# Patient Record
Sex: Male | Born: 1940 | Hispanic: No | Marital: Married | Smoking: Never smoker
Health system: Southern US, Community
[De-identification: ages and names within clinical notes are randomized; demographics above are authoritative.]

## PROBLEM LIST (undated history)

## (undated) DIAGNOSIS — I4891 Unspecified atrial fibrillation: Secondary | ICD-10-CM

## (undated) DIAGNOSIS — I2510 Atherosclerotic heart disease of native coronary artery without angina pectoris: Secondary | ICD-10-CM

## (undated) DIAGNOSIS — N200 Calculus of kidney: Secondary | ICD-10-CM

## (undated) DIAGNOSIS — K5730 Diverticulosis of large intestine without perforation or abscess without bleeding: Secondary | ICD-10-CM

## (undated) HISTORY — PX: CARDIAC SURGERY: SHX584

## (undated) HISTORY — PX: HERNIA REPAIR: SHX51

## (undated) HISTORY — PX: BICEPS TENDON REPAIR: SHX566

## (undated) HISTORY — PX: HEMORROIDECTOMY: SUR656

---

## 2003-03-06 ENCOUNTER — Ambulatory Visit (HOSPITAL_COMMUNITY): Admission: RE | Admit: 2003-03-06 | Discharge: 2003-03-06 | Payer: Self-pay | Admitting: Orthopedic Surgery

## 2003-03-06 ENCOUNTER — Ambulatory Visit (HOSPITAL_BASED_OUTPATIENT_CLINIC_OR_DEPARTMENT_OTHER): Admission: RE | Admit: 2003-03-06 | Discharge: 2003-03-06 | Payer: Self-pay | Admitting: Orthopedic Surgery

## 2011-01-11 ENCOUNTER — Emergency Department (INDEPENDENT_AMBULATORY_CARE_PROVIDER_SITE_OTHER): Payer: Medicare Other

## 2011-01-11 ENCOUNTER — Emergency Department (HOSPITAL_BASED_OUTPATIENT_CLINIC_OR_DEPARTMENT_OTHER)
Admission: EM | Admit: 2011-01-11 | Discharge: 2011-01-11 | Disposition: A | Discharge status: Home / Self Care | Payer: Medicare Other | Attending: Emergency Medicine | Admitting: Emergency Medicine

## 2011-01-11 ENCOUNTER — Encounter: Payer: Self-pay | Admitting: *Deleted

## 2011-01-11 DIAGNOSIS — S61209A Unspecified open wound of unspecified finger without damage to nail, initial encounter: Secondary | ICD-10-CM

## 2011-01-11 DIAGNOSIS — S62509B Fracture of unspecified phalanx of unspecified thumb, initial encounter for open fracture: Secondary | ICD-10-CM

## 2011-01-11 DIAGNOSIS — S62639B Displaced fracture of distal phalanx of unspecified finger, initial encounter for open fracture: Secondary | ICD-10-CM | POA: Insufficient documentation

## 2011-01-11 DIAGNOSIS — IMO0002 Reserved for concepts with insufficient information to code with codable children: Secondary | ICD-10-CM | POA: Insufficient documentation

## 2011-01-11 DIAGNOSIS — X58XXXA Exposure to other specified factors, initial encounter: Secondary | ICD-10-CM

## 2011-01-11 DIAGNOSIS — M79609 Pain in unspecified limb: Secondary | ICD-10-CM

## 2011-01-11 DIAGNOSIS — Y9359 Activity, other involving other sports and athletics played individually: Secondary | ICD-10-CM | POA: Insufficient documentation

## 2011-01-11 MED ORDER — CEPHALEXIN 500 MG PO CAPS: 500.0000 mg | ORAL_CAPSULE | Freq: Four times a day (QID) | ORAL | Status: AC

## 2011-01-11 MED ORDER — LIDOCAINE HCL 2 % IJ SOLN
20.0000 mL | Freq: Once | INTRAMUSCULAR | Status: DC
  Filled 2011-01-11: qty 1

## 2011-01-11 NOTE — ED Notes (Signed)
Pt states he was using a crossbow and injured his left thumb-lac to same per pt. "Pressure dressing applied by pt, but he states it will not stop bleeding"

## 2011-01-11 NOTE — ED Provider Notes (Signed)
Medical screening examination/treatment/procedure(s) were performed by non-physician practitioner and as supervising physician I was immediately available for consultation/collaboration.   Tonisha Silvey A Savior Himebaugh, MD 01/11/11 1926 

## 2011-01-11 NOTE — ED Provider Notes (Signed)
History     CSN: [id number] Arrival date & time: 01/11/2011  6:26 PM  Chief Complaint  Patient presents with  . Laceration    (Consider location/radiation/quality/duration/timing/severity/associated sxs/prior treatment) HPI Comments: Pt states that while using the crossbow he cut himself and he was unable to get it to stop bleeding  Patient is a 70 y.o. male presenting with skin laceration. The history is provided by the patient. No language interpreter was used.  Laceration  The incident occurred 1 to 2 hours ago. The laceration is located on the left hand. The laceration is 2 cm in size. Injury mechanism: the string of the crossbow. The pain is moderate. The pain has been constant since onset. He reports no foreign bodies present.    History reviewed. No pertinent past medical history.  Past Surgical History  Procedure Date  . Hernia repair   . Hemorroidectomy   . Biceps tendon repair     History reviewed. No pertinent family history.  History  Substance Use Topics  . Smoking status: Never Smoker   . Smokeless tobacco: Not on file  . Alcohol Use: No      Review of Systems  Constitutional: Negative.   Respiratory: Negative.   Cardiovascular: Negative.   Skin:       C/o laceration to the left thumb    Allergies  Sulfa drugs cross reactors and Tetracyclines & related  Home Medications   Current Outpatient Rx  Name Route Sig Dispense Refill  . ASPIRIN EC 81 MG PO TBEC Oral Take 81 mg by mouth daily.      Marland Kitchen GLUCOSAMINE-CHONDROITIN 500-400 MG PO TABS Oral Take 1 tablet by mouth daily.      Marland Kitchen ONE-DAILY MULTI VITAMINS PO TABS Oral Take 1 tablet by mouth daily.      Marland Kitchen CALCIUM POLYCARBOPHIL 625 MG PO TABS Oral Take 625 mg by mouth daily.        BP 170/82  Pulse 70  Temp(Src) 98.1 F (36.7 C) (Oral)  Resp 20  Ht 6\' 2"  (1.88 m)  Wt 193 lb (87.544 kg)  BMI 24.78 kg/m2  SpO2 99%  Physical Exam  Nursing note and vitals reviewed. Constitutional: He is  oriented to person, place, and time. He appears well-developed and well-nourished.  HENT:  Head: Normocephalic and atraumatic.  Cardiovascular:  Murmur heard. Pulmonary/Chest: Effort normal and breath sounds normal.  Musculoskeletal: Normal range of motion. He exhibits tenderness.  Neurological: He is alert and oriented to person, place, and time.  Skin:       Pt has obvious laceration of the left medial thumb:pt has a tear in the nailbed    ED Course  LACERATION REPAIR Date/Time: 01/11/2011 7:20 PM Performed by: Teressa Lower Authorized by: Teressa Lower Consent: Verbal consent obtained. Written consent not obtained. Risks and benefits: risks, benefits and alternatives were discussed Consent given by: patient Patient understanding: patient states understanding of the procedure being performed Patient identity confirmed: verbally with patient Time out: Immediately prior to procedure a "time out" was called to verify the correct patient, procedure, equipment, support staff and site/side marked as required. Body area: upper extremity Location details: left thumb Laceration length: 2 cm Foreign bodies: no foreign bodies Tendon involvement: none Nerve involvement: none Vascular damage: no Anesthesia: digital block Local anesthetic: lidocaine 2% without epinephrine Irrigation solution: saline Irrigation method: syringe Amount of cleaning: standard Skin closure: 4-0 Prolene Number of sutures: 3 Technique: simple Approximation: close Approximation difficulty: simple Patient tolerance: Patient tolerated the procedure  well with no immediate complications.   (including critical care time)  Labs Reviewed - No data to display Dg Finger Thumb Left  01/11/2011  *RADIOLOGY REPORT*  Clinical Data: Injury with pain.  LEFT THUMB 2+V  Comparison: None.  Findings: Tiny avulsion fragment is seen from the tuft of the distal phalanx.  Otherwise, there is no evidence for acute  fracture.  No subluxation or dislocation.  There appears to be a soft tissue laceration overlying the distal phalangeal tuft injury.  IMPRESSION: Apparent soft tissue laceration with cortical avulsion involving the tuft of the distal phalanx.  Imaging features are consistent with open fracture.  Original Report Authenticated By: ERIC A. MANSELL, M.D.     No diagnosis found.    MDM  Wound closed without any problem:will treat with antibiotics as open fracture:pt splinted by nursing staff        Teressa Lower, NP 01/11/11 1922

## 2011-01-11 NOTE — ED Notes (Signed)
Laceration to Top of L thumb bleeding controlled

## 2013-11-22 ENCOUNTER — Emergency Department (HOSPITAL_BASED_OUTPATIENT_CLINIC_OR_DEPARTMENT_OTHER)
Admission: EM | Admit: 2013-11-22 | Discharge: 2013-11-22 | Disposition: A | Discharge status: Other Acute Inpatient Hospital | Payer: Medicare Other | Attending: Emergency Medicine | Admitting: Emergency Medicine

## 2013-11-22 ENCOUNTER — Encounter (HOSPITAL_BASED_OUTPATIENT_CLINIC_OR_DEPARTMENT_OTHER): Payer: Self-pay | Admitting: Emergency Medicine

## 2013-11-22 DIAGNOSIS — Z9889 Other specified postprocedural states: Secondary | ICD-10-CM | POA: Insufficient documentation

## 2013-11-22 DIAGNOSIS — Z8679 Personal history of other diseases of the circulatory system: Secondary | ICD-10-CM | POA: Diagnosis not present

## 2013-11-22 DIAGNOSIS — K625 Hemorrhage of anus and rectum: Secondary | ICD-10-CM | POA: Diagnosis present

## 2013-11-22 DIAGNOSIS — Z87442 Personal history of urinary calculi: Secondary | ICD-10-CM | POA: Diagnosis not present

## 2013-11-22 HISTORY — DX: Unspecified atrial fibrillation: I48.91

## 2013-11-22 HISTORY — DX: Diverticulosis of large intestine without perforation or abscess without bleeding: K57.30

## 2013-11-22 HISTORY — DX: Calculus of kidney: N20.0

## 2013-11-22 LAB — CBC
HCT: 39.9 % (ref 39.0–52.0)
Hemoglobin: 13.5 g/dL (ref 13.0–17.0)
MCH: 31.7 pg (ref 26.0–34.0)
MCHC: 33.8 g/dL (ref 30.0–36.0)
MCV: 93.7 fL (ref 78.0–100.0)
PLATELETS: 165 10*3/uL (ref 150–400)
RBC: 4.26 MIL/uL (ref 4.22–5.81)
RDW: 14.6 % (ref 11.5–15.5)
WBC: 6.6 10*3/uL (ref 4.0–10.5)

## 2013-11-22 LAB — BASIC METABOLIC PANEL
ANION GAP: 12 (ref 5–15)
BUN: 26 mg/dL — ABNORMAL HIGH (ref 6–23)
CHLORIDE: 104 meq/L (ref 96–112)
CO2: 26 meq/L (ref 19–32)
Calcium: 9.5 mg/dL (ref 8.4–10.5)
Creatinine, Ser: 1.2 mg/dL (ref 0.50–1.35)
GFR calc Af Amer: 67 mL/min — ABNORMAL LOW (ref >90)
GFR calc non Af Amer: 58 mL/min — ABNORMAL LOW (ref >90)
Glucose, Bld: 147 mg/dL — ABNORMAL HIGH (ref 70–99)
Potassium: 4.2 mEq/L (ref 3.7–5.3)
SODIUM: 142 meq/L (ref 137–147)

## 2013-11-22 LAB — PROTIME-INR
INR: 1.95 — AB (ref 0.00–1.49)
Prothrombin Time: 22.2 seconds — ABNORMAL HIGH (ref 11.6–15.2)

## 2013-11-22 LAB — OCCULT BLOOD X 1 CARD TO LAB, STOOL: FECAL OCCULT BLD: POSITIVE — AB

## 2013-11-22 MED ORDER — PHYTONADIONE 5 MG PO TABS
10.0000 mg | ORAL_TABLET | Freq: Once | ORAL | Status: AC
  Administered 2013-11-22: 10 mg via ORAL
  Filled 2013-11-22: qty 2

## 2013-11-22 NOTE — ED Provider Notes (Signed)
Medical screening examination/treatment/procedure(s) were conducted as a shared visit with non-physician practitioner(s) and myself.  I personally evaluated the patient during the encounter.   EKG Interpretation None      Results for orders placed during the hospital encounter of 11/22/13  CBC      Result Value Ref Range   WBC 6.6  4.0 - 10.5 K/uL   RBC 4.26  4.22 - 5.81 MIL/uL   Hemoglobin 13.5  13.0 - 17.0 g/dL   HCT 40.9  81.1 - 91.4 %   MCV 93.7  78.0 - 100.0 fL   MCH 31.7  26.0 - 34.0 pg   MCHC 33.8  30.0 - 36.0 g/dL   RDW 78.2  95.6 - 21.3 %   Platelets 165  150 - 400 K/uL  BASIC METABOLIC PANEL      Result Value Ref Range   Sodium 142  137 - 147 mEq/L   Potassium 4.2  3.7 - 5.3 mEq/L   Chloride 104  96 - 112 mEq/L   CO2 26  19 - 32 mEq/L   Glucose, Bld 147 (*) 70 - 99 mg/dL   BUN 26 (*) 6 - 23 mg/dL   Creatinine, Ser 0.86  0.50 - 1.35 mg/dL   Calcium 9.5  8.4 - 57.8 mg/dL   GFR calc non Af Amer 58 (*) >90 mL/min   GFR calc Af Amer 67 (*) >90 mL/min   Anion gap 12  5 - 15  OCCULT BLOOD X 1 CARD TO LAB, STOOL      Result Value Ref Range   Fecal Occult Bld POSITIVE (*) NEGATIVE  PROTIME-INR      Result Value Ref Range   Prothrombin Time 22.2 (*) 11.6 - 15.2 seconds   INR 1.95 (*) 0.00 - 1.49    Patient with history of rectal bleeding. 3 episodes of blood today of red blood in staining were all red. Hemodynamically stable hemoglobin and hematocrit without significant anemia. Patient has a history of diverticulosis. Patient's last colonoscopy was 3 years ago. Although patient stable currently abdomen soft and nontender rectal exam is gross blood. Patient will require admission for monitoring, he has the potential for a significant GI bleed here. Patient is followed by [facility] and prefers admission there.  Vanetta Mulders, MD 11/22/13 [phone]

## 2013-11-22 NOTE — ED Provider Notes (Signed)
CSN: [id number]     Arrival date & time 11/22/13  1448 History   First MD Initiated Contact with Patient 11/22/13 1454     Chief Complaint  Patient presents with  . Rectal Bleeding     (Consider location/radiation/quality/duration/timing/severity/associated sxs/prior Treatment) HPI  This is a 73 year old male with a past medical history of diverticulitis, A. fib, on chronic Coumadin use who presents emergency Department with chief complaint of rectal bleeding. Patient states that this morning he had his normal morning bowel movement which was loose and covered in bright red blood. Patient had a 3 further episodes of the same thing. He states that when wiping the tissue paper was covered with blood. He states that the red blood was also mixed with the stool. He denies any abdominal pain, nausea, vomiting, chills, fevers, myalgias, or urinary symptoms. He has a recent history of diverticulitis and completed a course of antibiotics one and a half weeks ago. Patient's last INR was 2.3 one week ago.  Past Medical History  Diagnosis Date  . Diverticula, colon   . Kidney stone   . Atrial fibrillation     history of with cardioversion and no longer A Fib per Pt.   Past Surgical History  Procedure Laterality Date  . Hernia repair    . Hemorroidectomy    . Biceps tendon repair    . Cardiac surgery      valve repair in Feb. 2015   No family history on file. History  Substance Use Topics  . Smoking status: Never Smoker   . Smokeless tobacco: Not on file  . Alcohol Use: No    Review of Systems Ten systems reviewed and are negative for acute change, except as noted in the HPI.     Allergies  Duratuss g; Hydrocodone; Sulfa drugs cross reactors; and Tetracyclines & related  Home Medications   Prior to Admission medications   Medication Sig Start Date End Date Taking? Authorizing Provider  amiodarone (PACERONE) 200 MG tablet Take 100 mg by mouth daily.   Yes Historical Provider, MD   warfarin (COUMADIN) 2.5 MG tablet Take 2.5 mg by mouth daily.   Yes Historical Provider, MD  aspirin EC 81 MG tablet Take 81 mg by mouth daily.      Historical Provider, MD  glucosamine-chondroitin 500-400 MG tablet Take 1 tablet by mouth daily.      Historical Provider, MD  Multiple Vitamin (MULTIVITAMIN) tablet Take 1 tablet by mouth daily.      Historical Provider, MD  polycarbophil (FIBERCON) 625 MG tablet Take 625 mg by mouth daily.      Historical Provider, MD   BP 127/70  Pulse 82  Temp(Src) 97.7 F (36.5 C) (Oral)  Resp 16  Ht  (1.854 m)  Wt 190 lb (86.183 kg)  BMI 25.07 kg/m2 Physical Exam Physical Exam  Nursing note and vitals reviewed. Constitutional: He appears well-developed and well-nourished. No distress.  HENT:  Head: Normocephalic and atraumatic.  Eyes: Conjunctivae normal are normal. No scleral icterus.  Neck: Normal range of motion. Neck supple.  Cardiovascular: Normal rate, regular rhythm and normal heart sounds.   Pulmonary/Chest: Effort normal and breath sounds normal. No respiratory distress.  Abdominal: Soft. There is no tenderness.  Musculoskeletal: He exhibits no edema.  Neurological: He is alert.  Skin: Skin is warm and dry. He is not diaphoretic.  GU: Digital Rectal Exam reveals sphincter with good tone. Non-thrombosed external hemorrhoids. No masses or fissures. Overt red blood on rectal  exam. Psychiatric: His behavior is normal.    ED Course  Procedures (including critical care time) Labs Review Labs Reviewed  CBC  BASIC METABOLIC PANEL  OCCULT BLOOD X 1 CARD TO LAB, STOOL  PROTIME-INR    Imaging Review No results found.   EKG Interpretation None      MDM   Final diagnoses:  None    4:00 PM BP 127/70  Pulse 82  Temp(Src) 97.7 F (36.5 C) (Oral)  Resp 16  Ht  (1.854 m)  Wt 190 lb (86.183 kg)  BMI 25.07 kg/m2 Patient with bright red blood per rectum, his CBC is normal with a normal hemoglobin and hematocrit. INR  appears to be subtherapeutic. Patient does admit to eating greens this past weekend. He denies any symptoms of orthostasis.   Patient seen in shared visit with attending physician.  Patient with 3 episodes of rectal bleeding. On coumading. He is accepted for obs admission by Dr. Mikeal Hawthorne [facility]. He will receive  vit K. INR subtherapeutic. I personally reviewed the imaging tests through PACS system. I have reviewed and interpreted Lab values. I reviewed available ER/hospitalization records through the EMR  The patient appears reasonably stabilized for admission considering the current resources, flow, and capabilities available in the ED at this time, and I doubt any other [facility] requiring further screening and/or treatment in the ED prior to admission.   MDM Number of Diagnoses or Management Options Rectal bleeding:  =  Arthor Captain, PA-C 11/22/13 1919

## 2013-11-22 NOTE — ED Notes (Signed)
Pt. Reports he has had 3 episodes of rectal bleeding with bright red blood and no clots.  Approx. 1 wk ago the Pt. Reports having 1 episode and then it stopped.  Today he reports having 3 bms.   NOTE :  Pt. Reports his pupils being dilated today and he is still dealing with this today.

## 2017-03-30 ENCOUNTER — Other Ambulatory Visit: Payer: Self-pay

## 2017-03-30 ENCOUNTER — Emergency Department (HOSPITAL_BASED_OUTPATIENT_CLINIC_OR_DEPARTMENT_OTHER)
Admission: EM | Admit: 2017-03-30 | Discharge: 2017-03-30 | Disposition: A | Discharge status: Home / Self Care | Payer: Medicare Other | Attending: Physician Assistant | Admitting: Physician Assistant

## 2017-03-30 ENCOUNTER — Emergency Department (HOSPITAL_BASED_OUTPATIENT_CLINIC_OR_DEPARTMENT_OTHER): Payer: Medicare Other

## 2017-03-30 ENCOUNTER — Encounter (HOSPITAL_BASED_OUTPATIENT_CLINIC_OR_DEPARTMENT_OTHER): Payer: Self-pay | Admitting: Emergency Medicine

## 2017-03-30 DIAGNOSIS — Z7982 Long term (current) use of aspirin: Secondary | ICD-10-CM | POA: Insufficient documentation

## 2017-03-30 DIAGNOSIS — M25562 Pain in left knee: Secondary | ICD-10-CM | POA: Diagnosis present

## 2017-03-30 DIAGNOSIS — Z7901 Long term (current) use of anticoagulants: Secondary | ICD-10-CM | POA: Diagnosis not present

## 2017-03-30 DIAGNOSIS — I2510 Atherosclerotic heart disease of native coronary artery without angina pectoris: Secondary | ICD-10-CM | POA: Insufficient documentation

## 2017-03-30 HISTORY — DX: Atherosclerotic heart disease of native coronary artery without angina pectoris: I25.10

## 2017-03-30 NOTE — ED Provider Notes (Signed)
[facility] EMERGENCY DEPARTMENT Provider Note   CSN: [id number] Arrival date & time: 03/30/17  1525     History   Chief Complaint Chief Complaint  Patient presents with  . Knee Injury    HPI [name] is a 76 y.o. male.  HPI  Patient is a 76 year old male presenting with knee pain.  Patient had mechanical fall and landed on his left knee last night at 8 PM.  X-rays negative.  Patient has small effusion.  Not on any blood thinners.  It is able to ambulate, with mild pain.  Patient follows with Dr. Alda BertholdNourse his orthopedist.  Instructions to rest ice elevate.  Patient is able to have good range of motion, good distal sensation and refill.   Past Medical History:  Diagnosis Date  . Atrial fibrillation (HCC)    history of with cardioversion and no longer A Fib per Pt.  . Coronary artery disease   . Diverticula, colon   . Kidney stone     There are no active problems to display for this patient.   Past Surgical History:  Procedure Laterality Date  . BICEPS TENDON REPAIR    . CARDIAC SURGERY     valve repair in Feb. 2015  . HEMORROIDECTOMY    . HERNIA REPAIR         Home Medications    Prior to Admission medications   Medication Sig Start Date End Date Taking? Authorizing Provider  aspirin EC 81 MG tablet Take 81 mg by mouth daily.     Yes [provider]  diltiazem (CARDIZEM CD) 180 MG 24 hr capsule Take 180 mg by mouth daily.   Yes [provider]  Multiple Vitamin (MULTIVITAMIN) tablet Take 1 tablet by mouth daily.     Yes [provider]  nortriptyline (PAMELOR) 50 MG capsule Take 50 mg by mouth at bedtime.   Yes [provider]  polycarbophil (FIBERCON) 625 MG tablet Take 625 mg by mouth daily.     Yes [provider]  amiodarone (PACERONE) 200 MG tablet Take 100 mg by mouth daily.    [provider]  glucosamine-chondroitin 500-400 MG tablet Take 1 tablet by mouth daily.      [provider]  warfarin (COUMADIN) 2.5 MG tablet Take 2.5 mg by mouth daily.    [provider]    Family History No family history on file.  Social History Social History   Tobacco Use  . Smoking status: Never Smoker  . Smokeless tobacco: Never Used  Substance Use Topics  . Alcohol use: No  . Drug use: No     Allergies   Amiodarone; Duratuss g [guaifenesin]; Hydrocodone; Sulfa drugs cross reactors; and Tetracyclines & related   Review of Systems Review of Systems  Constitutional: Negative for activity change.  Cardiovascular: Negative for chest pain.     Physical Exam Updated Vital Signs BP 129/79 (BP Location: Left Arm)   Pulse 90   Temp 98.3 F (36.8 C) (Oral)   Resp 18   Ht 6' (1.829 m)   Wt 88.5 kg (195 lb)   SpO2 97%   BMI 26.45 kg/m   Physical Exam  Constitutional: He is oriented to person, place, and time. He appears well-nourished.  HENT:  Head: Normocephalic.  Eyes: Conjunctivae are normal.  Cardiovascular: Normal rate.  Pulmonary/Chest: Effort normal.  Musculoskeletal:  Mild effusion.  Good range of motion.  Right leg otherwise appears normal. Able to straight leg raise and  raiase at knee.  Able to ambulate with mild pain.  Neurological: He is oriented to person, place, and time.  Skin: Skin is warm and dry. He is not diaphoretic.  Psychiatric: He has a normal mood and affect. His behavior is normal.     ED Treatments / Results  Labs (all labs ordered are listed, but only abnormal results are displayed) Labs Reviewed - No data to display  EKG  EKG Interpretation None       Radiology Dg Knee Complete 4 Views Left  Result Date: 03/30/2017 CLINICAL DATA:  Injury to LEFT knee yesterday, lost balance while walking over a puddle outside of his home, hyperflexion of knee, now with swelling, tenderness and pain extending into distal femur EXAM: LEFT KNEE - COMPLETE 4+ VIEW COMPARISON:  MRI LEFT knee 07/19/2008 FINDINGS: Osseous  demineralization. Joint spaces preserved. Chondrocalcinosis question CPPD. No acute fracture, dislocation or bone destruction. Moderate-sized knee joint effusion present. IMPRESSION: Moderate knee joint effusion without acute fracture or dislocation. Question CPPD. Electronically Signed   By: Ulyses SouthwardMark  Boles M.D.   On: 03/30/2017 16:27    Procedures Procedures (including critical care time)  Medications Ordered in ED Medications - No data to display   Initial Impression / Assessment and Plan / ED Course  I have reviewed the triage vital signs and the nursing notes.  Pertinent labs & imaging results that were available during my care of the patient were reviewed by me and considered in my medical decision making (see chart for details).     Patient is a 76 year old male presenting with knee pain.  Patient had mechanical fall and landed on his left knee last night at 8 PM.  X-rays negative.  Patient has small effusion.  Not on any blood thinners.  It is able to ambulate, with mild pain.  Patient follows with Dr. Ranell PatrickNorris his orthopedist.  Instructions to rest ice elevate.  Patient is able to have good range of motion, good distal sensation and refill.  Will treat with symptomatic care, follow-up.   Final Clinical Impressions(s) / ED Diagnoses   Final diagnoses:  Acute pain of left knee    ED Discharge Orders    None       Abelino DerrickMackuen, Leyda Vanderwerf Lyn, MD 03/30/17 1815

## 2017-03-30 NOTE — ED Triage Notes (Addendum)
States fell last night due to wet grass and it was dark. Landed on  knees and pain with walking to left knee  , with swelling. No LOC

## 2017-03-30 NOTE — ED Notes (Signed)
ED Provider at bedside. 

## 2017-03-30 NOTE — Discharge Instructions (Signed)
We think you likely have an effusion due to the trauma.  However we do not see any fractures.  Please follow-up with Dr. Ranell PatrickNorris, orthopedist, if not improving in 6-10 days.  Please use knee sleeve, rest ice and elevate.

## 2017-06-26 ENCOUNTER — Inpatient Hospital Stay (HOSPITAL_BASED_OUTPATIENT_CLINIC_OR_DEPARTMENT_OTHER)
Admission: EM | Admit: 2017-06-26 | Discharge: 2017-06-28 | DRG: 494 | Disposition: A | Discharge status: Home / Self Care | Payer: Medicare Other | Attending: Orthopedic Surgery | Admitting: Orthopedic Surgery

## 2017-06-26 ENCOUNTER — Emergency Department (HOSPITAL_BASED_OUTPATIENT_CLINIC_OR_DEPARTMENT_OTHER): Payer: Medicare Other

## 2017-06-26 ENCOUNTER — Encounter (HOSPITAL_BASED_OUTPATIENT_CLINIC_OR_DEPARTMENT_OTHER): Payer: Self-pay | Admitting: *Deleted

## 2017-06-26 ENCOUNTER — Other Ambulatory Visit: Payer: Self-pay

## 2017-06-26 DIAGNOSIS — W010XXA Fall on same level from slipping, tripping and stumbling without subsequent striking against object, initial encounter: Secondary | ICD-10-CM | POA: Diagnosis present

## 2017-06-26 DIAGNOSIS — S42291A Other displaced fracture of upper end of right humerus, initial encounter for closed fracture: Principal | ICD-10-CM | POA: Diagnosis present

## 2017-06-26 DIAGNOSIS — S52134A Nondisplaced fracture of neck of right radius, initial encounter for closed fracture: Secondary | ICD-10-CM | POA: Diagnosis present

## 2017-06-26 DIAGNOSIS — Z7982 Long term (current) use of aspirin: Secondary | ICD-10-CM | POA: Diagnosis not present

## 2017-06-26 DIAGNOSIS — Z888 Allergy status to other drugs, medicaments and biological substances status: Secondary | ICD-10-CM

## 2017-06-26 DIAGNOSIS — Z79899 Other long term (current) drug therapy: Secondary | ICD-10-CM

## 2017-06-26 DIAGNOSIS — Y92007 Garden or yard of unspecified non-institutional (private) residence as the place of occurrence of the external cause: Secondary | ICD-10-CM

## 2017-06-26 DIAGNOSIS — I4891 Unspecified atrial fibrillation: Secondary | ICD-10-CM | POA: Diagnosis present

## 2017-06-26 DIAGNOSIS — I2510 Atherosclerotic heart disease of native coronary artery without angina pectoris: Secondary | ICD-10-CM | POA: Diagnosis present

## 2017-06-26 DIAGNOSIS — Z882 Allergy status to sulfonamides status: Secondary | ICD-10-CM | POA: Diagnosis not present

## 2017-06-26 DIAGNOSIS — Z881 Allergy status to other antibiotic agents status: Secondary | ICD-10-CM | POA: Diagnosis not present

## 2017-06-26 DIAGNOSIS — Z7901 Long term (current) use of anticoagulants: Secondary | ICD-10-CM

## 2017-06-26 DIAGNOSIS — S42221A 2-part displaced fracture of surgical neck of right humerus, initial encounter for closed fracture: Secondary | ICD-10-CM | POA: Diagnosis present

## 2017-06-26 DIAGNOSIS — R110 Nausea: Secondary | ICD-10-CM | POA: Diagnosis not present

## 2017-06-26 DIAGNOSIS — M62838 Other muscle spasm: Secondary | ICD-10-CM | POA: Diagnosis not present

## 2017-06-26 DIAGNOSIS — W19XXXA Unspecified fall, initial encounter: Secondary | ICD-10-CM

## 2017-06-26 DIAGNOSIS — S42309A Unspecified fracture of shaft of humerus, unspecified arm, initial encounter for closed fracture: Secondary | ICD-10-CM | POA: Diagnosis present

## 2017-06-26 LAB — BASIC METABOLIC PANEL
Anion gap: 10 (ref 5–15)
BUN: 14 mg/dL (ref 6–20)
CALCIUM: 9.3 mg/dL (ref 8.9–10.3)
CO2: 23 mmol/L (ref 22–32)
CREATININE: 0.94 mg/dL (ref 0.61–1.24)
Chloride: 105 mmol/L (ref 101–111)
Glucose, Bld: 137 mg/dL — ABNORMAL HIGH (ref 65–99)
Potassium: 4.1 mmol/L (ref 3.5–5.1)
SODIUM: 138 mmol/L (ref 135–145)

## 2017-06-26 LAB — TYPE AND SCREEN
ABO/RH(D): A POS
ANTIBODY SCREEN: NEGATIVE

## 2017-06-26 LAB — CBC
HCT: 43.5 % (ref 39.0–52.0)
Hemoglobin: 14.7 g/dL (ref 13.0–17.0)
MCH: 31.9 pg (ref 26.0–34.0)
MCHC: 33.8 g/dL (ref 30.0–36.0)
MCV: 94.4 fL (ref 78.0–100.0)
PLATELETS: 153 10*3/uL (ref 150–400)
RBC: 4.61 MIL/uL (ref 4.22–5.81)
RDW: 13.5 % (ref 11.5–15.5)
WBC: 9.3 10*3/uL (ref 4.0–10.5)

## 2017-06-26 LAB — ABO/RH: ABO/RH(D): A POS

## 2017-06-26 LAB — PROTIME-INR
INR: 1.09
Prothrombin Time: 14 seconds (ref 11.4–15.2)

## 2017-06-26 MED ORDER — PROMETHAZINE HCL 25 MG PO TABS
12.5000 mg | ORAL_TABLET | Freq: Three times a day (TID) | ORAL | Status: DC | PRN
  Administered 2017-06-27: 12.5 mg via ORAL
  Filled 2017-06-26: qty 1

## 2017-06-26 MED ORDER — HYDROMORPHONE HCL 1 MG/ML IJ SOLN
1.0000 mg | INTRAMUSCULAR | Status: DC | PRN
  Administered 2017-06-26 – 2017-06-27 (×3): 1 mg via INTRAVENOUS
  Filled 2017-06-26 (×3): qty 1

## 2017-06-26 MED ORDER — HYDROMORPHONE HCL 2 MG PO TABS: 2.0000 mg | ORAL_TABLET | ORAL | Status: DC | PRN

## 2017-06-26 MED ORDER — FENTANYL CITRATE (PF) 100 MCG/2ML IJ SOLN
50.0000 ug | Freq: Once | INTRAMUSCULAR | Status: AC
  Administered 2017-06-26: 50 ug via INTRAVENOUS
  Filled 2017-06-26: qty 2

## 2017-06-26 MED ORDER — DILTIAZEM HCL ER COATED BEADS 180 MG PO CP24
180.0000 mg | ORAL_CAPSULE | Freq: Every day | ORAL | Status: DC
  Administered 2017-06-27 – 2017-06-28 (×2): 180 mg via ORAL
  Filled 2017-06-26 (×2): qty 1

## 2017-06-26 MED ORDER — ONDANSETRON HCL 4 MG/2ML IJ SOLN
4.0000 mg | Freq: Once | INTRAMUSCULAR | Status: AC
  Administered 2017-06-26: 4 mg via INTRAVENOUS
  Filled 2017-06-26: qty 2

## 2017-06-26 MED ORDER — DEXTROSE 5 % IV SOLN
500.0000 mg | Freq: Three times a day (TID) | INTRAVENOUS | Status: DC | PRN
  Administered 2017-06-27: 500 mg via INTRAVENOUS
  Filled 2017-06-26 (×2): qty 5

## 2017-06-26 NOTE — ED Triage Notes (Signed)
Pt fell on Right arm on concrete. His elbow is bleeding and he stated he cant move that arm.

## 2017-06-26 NOTE — ED Notes (Signed)
EDP at BS 

## 2017-06-26 NOTE — Progress Notes (Signed)
Pt arrived to the unit. VS are stable. MD is notified for admitting orders.

## 2017-06-26 NOTE — ED Notes (Signed)
Alert, NAD, calm, interactive, resps e/u, speaking in clear complete sentences, no dyspnea noted, skin W&D, VSS, R shoulder pain improved, also slight nausea, improved after meds, (denies: sob, spasms, numbness, tingling, dizziness or visual changes). [facility].

## 2017-06-26 NOTE — ED Provider Notes (Signed)
[facility] EMERGENCY DEPARTMENT Provider Note   CSN: [id number] Arrival date & time: 06/26/17  1446     History   Chief Complaint Chief Complaint  Patient presents with  . Fall    HPI Osinachi Navarrette is a 77 y.o. male.  77 year old male on aspirin as an anticoagulant had a mechanical trip and fall while working in the yard today.  He states he got his feet tripped up in the IV and landed on his right elbow.  He is complaining of severe pain in his right shoulder.  He denies striking his head no LOC and has no back pain chest pain abdominal pain.  He denies any weakness or tingling in his extremities.  The history is provided by the patient.  Fall  The current episode started 1 to 2 hours ago. The problem occurs constantly. The problem has not changed since onset.Pertinent negatives include no chest pain, no abdominal pain, no headaches and no shortness of breath. The symptoms are aggravated by twisting and bending. The symptoms are relieved by rest. He has tried nothing for the symptoms. The treatment provided no relief.    Past Medical History:  Diagnosis Date  . Atrial fibrillation (HCC)    history of with cardioversion and no longer A Fib per Pt.  . Coronary artery disease   . Diverticula, colon   . Kidney stone     There are no active problems to display for this patient.   Past Surgical History:  Procedure Laterality Date  . BICEPS TENDON REPAIR    . CARDIAC SURGERY     valve repair in Feb. 2015  . HEMORROIDECTOMY    . HERNIA REPAIR          Home Medications    Prior to Admission medications   Medication Sig Start Date End Date Taking? Authorizing Provider  aspirin EC 81 MG tablet Take 81 mg by mouth daily.     Yes [provider]  diltiazem (CARDIZEM CD) 180 MG 24 hr capsule Take 180 mg by mouth daily.   Yes [provider]  Multiple Vitamin (MULTIVITAMIN) tablet Take 1 tablet by mouth daily.     Yes [provider]    polycarbophil (FIBERCON) 625 MG tablet Take 625 mg by mouth daily.     Yes [provider]  amiodarone (PACERONE) 200 MG tablet Take 100 mg by mouth daily.    [provider]  glucosamine-chondroitin 500-400 MG tablet Take 1 tablet by mouth daily.      [provider]  nortriptyline (PAMELOR) 50 MG capsule Take 50 mg by mouth at bedtime.    [provider]  warfarin (COUMADIN) 2.5 MG tablet Take 2.5 mg by mouth daily.    [provider]    Family History History reviewed. No pertinent family history.  Social History Social History   Tobacco Use  . Smoking status: Never Smoker  . Smokeless tobacco: Never Used  Substance Use Topics  . Alcohol use: No  . Drug use: No     Allergies   Amiodarone; Duratuss g [guaifenesin]; Hydrocodone; Sulfa drugs cross reactors; and Tetracyclines & related   Review of Systems Review of Systems  Constitutional: Negative for fever.  HENT: Negative for sore throat.   Respiratory: Negative for shortness of breath.   Cardiovascular: Negative for chest pain.  Gastrointestinal: Negative for abdominal pain.  Genitourinary: Negative for dysuria.  Musculoskeletal: Negative for back pain and neck pain.  Skin: Negative for rash.  Neurological: Negative for headaches.     Physical Exam Updated Vital Signs BP 90/63 (BP Location: Left Arm)   Pulse 63   Temp 97.9 F (36.6 C)   Resp 18   Ht 6' (1.829 m)   Wt 88.5 kg (195 lb)   SpO2 97%   BMI 26.45 kg/m   Physical Exam  Constitutional: He appears well-developed and well-nourished.  HENT:  Head: Normocephalic and atraumatic.  Eyes: Conjunctivae are normal.  Neck: Neck supple.  Cardiovascular: Normal rate and regular rhythm.  Pulmonary/Chest: Effort normal and breath sounds normal.  Abdominal: Soft. Bowel sounds are normal.  Musculoskeletal: He exhibits tenderness and deformity.  Right upper extremity he is got tenderness at the shoulder with some  deformity.  He is got normal axillary nerve sensation.  There is some mild tenderness at the right elbow with an abrasion.  His forearm and wrist are nontender with full range of motion.  He is got radial ulnar and median distal function.  Cap refill brisk and radial artery 2+ pulse.  Full range of motion of his other extremities without any tenderness or deformity.  No pain on palpation of the spine.  Neurological: He is alert. GCS eye subscore is 4. GCS verbal subscore is 5. GCS motor subscore is 6.  Skin: Skin is warm and dry.  Psychiatric: He has a normal mood and affect.  Nursing note and vitals reviewed.    ED Treatments / Results  Labs (all labs ordered are listed, but only abnormal results are displayed) Labs Reviewed - No data to display  EKG None  Radiology Dg Shoulder 1v Right  Result Date: 06/26/2017 CLINICAL DATA:  Fall with pain EXAM: RIGHT SHOULDER - 1 VIEW COMPARISON:  Humerus x-ray 06/26/2017 FINDINGS: Displaced right humeral neck fracture. Right humeral head projects over the glenoid fossa. IMPRESSION: No humeral head dislocation. Acute displaced right femoral neck fracture. Electronically Signed   By: Jasmine PangKim  Fujinaga M.D.   On: 06/26/2017 18:24   Dg Elbow 2 Views Right  Result Date: 06/26/2017 CLINICAL DATA:  Fall with pain EXAM: RIGHT ELBOW - 2 VIEW COMPARISON:  None. FINDINGS: No dislocation. Questionable tiny radial head fracture on lateral view. Elbow effusion present IMPRESSION: Elbow effusion with questionable tiny radial head fracture. Electronically Signed   By: Jasmine PangKim  Fujinaga M.D.   On: 06/26/2017 18:25   Ct Shoulder Right Wo Contrast  Result Date: 06/27/2017 CLINICAL DATA:  Right humeral fracture. EXAM: CT OF THE UPPER RIGHT EXTREMITY WITHOUT CONTRAST TECHNIQUE: Multidetector CT imaging of the upper right extremity was performed according to the standard protocol. COMPARISON:  None. FINDINGS: Bones/Joint/Cartilage Comminuted fracture of the surgical neck of the  right proximal humerus extending into the greater tuberosity. No displacement of the greater tuberosity. Apex volar angulation with 14 mm of anterior displacement and 8 mm of medial displacement of the proximal shaft relative to the humeral head. No articular surface involvement. Mild joint space narrowing of the right glenohumeral joint with small marginal osteophytes consistent with mild osteoarthritis. Moderate arthropathy of the acromioclavicular joint. Ligaments Suboptimally assessed by CT. Muscles and Tendons No muscle atrophy.  No intramuscular fluid collection or hematoma. Soft tissues Soft tissues are normal. No soft tissue mass. Visualize right lung is clear. Prior CABG. IMPRESSION: 1. Comminuted fracture of the surgical neck of the right proximal humerus extending into the greater tuberosity as described above. Electronically Signed   By: Elige KoHetal  Patel   On: 06/27/2017 09:45   Dg Humerus Right  Result Date:  06/26/2017 CLINICAL DATA:  Fall today.  Arm and shoulder pain. EXAM: RIGHT HUMERUS - 2+ VIEW COMPARISON:  None. FINDINGS: A comminuted fracture at the surgical neck of the right humerus demonstrates lateral displacement. The glenohumeral joint is intact. No additional fractures are evident. IMPRESSION: Proximal right humerus fracture at the surgical neck with lateral displacement. Electronically Signed   By: Marin Roberts M.D.   On: 06/26/2017 16:45    Procedures Procedures (including critical care time)  Medications Ordered in ED Medications  fentaNYL (SUBLIMAZE) injection 50 mcg (has no administration in time range)     Initial Impression / Assessment and Plan / ED Course  I have reviewed the triage vital signs and the nursing notes.  Pertinent labs & imaging results that were available during my care of the patient were reviewed by me and considered in my medical decision making (see chart for details).  Clinical Course as of Jun 28 1742  Fri Jun 26, 2017  1829 Sent  Patient back for some extra films after some IV pain medication.  Initial imaging was incomplete and difficult to tell if the humeral head was located.  They still were unable to get completions of everything but I do not see an obvious elbow fracture and the head appears located.  I am paging orthopedics/[facility] Dr. Amanda Pea or his coverage as the patient is seen him in the past for knee issues.   [MB]  1910 Discussed with Dr. Linna Caprice orthopedist.  He asked if we would admit the patient to [facility] on his service where they can work on pain control and have him seen tomorrow to discuss his options.  Patient is agreeable to this.   [MB]    Clinical Course User Index [MB] Terrilee Files, MD     Final Clinical Impressions(s) / ED Diagnoses   Final diagnoses:  2-part displaced fracture of surgical neck of right humerus, initial encounter for closed fracture    ED Discharge Orders    None       Terrilee Files, MD 06/27/17 1745

## 2017-06-26 NOTE — Progress Notes (Signed)
Orthopedic Tech Progress Note Patient Details:  Ishmael HolterKenneth Aycock Oct 07, 1940 [id number]  Ortho Devices Type of Ortho Device: Arm sling Ortho Device/Splint Location: right Ortho Device/Splint Interventions: Application, Adjustment   Post Interventions Patient Tolerated: Well Instructions Provided: Adjustment of device, Care of device   Alvina ChouWilliams, Rylei Masella C 06/26/2017, 10:53 PM

## 2017-06-26 NOTE — ED Notes (Signed)
[facility] No changes.

## 2017-06-27 ENCOUNTER — Inpatient Hospital Stay (HOSPITAL_COMMUNITY): Payer: Medicare Other | Admitting: Certified Registered Nurse Anesthetist

## 2017-06-27 ENCOUNTER — Encounter (HOSPITAL_COMMUNITY)
Admission: EM | Disposition: A | Discharge status: Home / Self Care | Payer: Self-pay | Source: Home / Self Care | Attending: Orthopedic Surgery

## 2017-06-27 ENCOUNTER — Encounter (HOSPITAL_COMMUNITY): Payer: Self-pay | Admitting: Certified Registered Nurse Anesthetist

## 2017-06-27 ENCOUNTER — Inpatient Hospital Stay (HOSPITAL_COMMUNITY): Payer: Medicare Other

## 2017-06-27 DIAGNOSIS — S42221A 2-part displaced fracture of surgical neck of right humerus, initial encounter for closed fracture: Secondary | ICD-10-CM | POA: Diagnosis present

## 2017-06-27 HISTORY — PX: ORIF SHOULDER FRACTURE: SHX5035

## 2017-06-27 LAB — SURGICAL PCR SCREEN
MRSA, PCR: NEGATIVE
Staphylococcus aureus: POSITIVE — AB

## 2017-06-27 SURGERY — OPEN REDUCTION INTERNAL FIXATION (ORIF) SHOULDER FRACTURE
Anesthesia: General | Site: Shoulder | Laterality: Right

## 2017-06-27 MED ORDER — CEFAZOLIN SODIUM-DEXTROSE 2-4 GM/100ML-% IV SOLN
2.0000 g | INTRAVENOUS | Status: AC
  Administered 2017-06-27: 2 g via INTRAVENOUS

## 2017-06-27 MED ORDER — FENTANYL CITRATE (PF) 250 MCG/5ML IJ SOLN
INTRAMUSCULAR | Status: DC | PRN
  Administered 2017-06-27: 50 ug via INTRAVENOUS

## 2017-06-27 MED ORDER — GLUCOSAMINE-CHONDROITIN 500-400 MG PO TABS: 1.0000 | ORAL_TABLET | Freq: Every day | ORAL | Status: DC

## 2017-06-27 MED ORDER — ONDANSETRON HCL 4 MG PO TABS: 4.0000 mg | ORAL_TABLET | Freq: Four times a day (QID) | ORAL | Status: DC | PRN

## 2017-06-27 MED ORDER — FENTANYL CITRATE (PF) 100 MCG/2ML IJ SOLN
INTRAMUSCULAR | Status: AC
  Administered 2017-06-27: 50 ug via INTRAVENOUS
  Filled 2017-06-27: qty 2

## 2017-06-27 MED ORDER — POLYETHYLENE GLYCOL 3350 17 G PO PACK: 17.0000 g | PACK | Freq: Every day | ORAL | Status: DC | PRN

## 2017-06-27 MED ORDER — CALCIUM POLYCARBOPHIL 625 MG PO TABS
625.0000 mg | ORAL_TABLET | Freq: Every day | ORAL | Status: DC
  Administered 2017-06-27 – 2017-06-28 (×2): 625 mg via ORAL
  Filled 2017-06-27 (×2): qty 1

## 2017-06-27 MED ORDER — FENTANYL CITRATE (PF) 250 MCG/5ML IJ SOLN
INTRAMUSCULAR | Status: AC
  Filled 2017-06-27: qty 5

## 2017-06-27 MED ORDER — CHLORHEXIDINE GLUCONATE 4 % EX LIQD: 60.0000 mL | Freq: Once | CUTANEOUS | Status: DC

## 2017-06-27 MED ORDER — FENTANYL CITRATE (PF) 100 MCG/2ML IJ SOLN
50.0000 ug | Freq: Once | INTRAMUSCULAR | Status: AC
  Administered 2017-06-27: 50 ug via INTRAVENOUS
  Filled 2017-06-27: qty 1

## 2017-06-27 MED ORDER — LACTATED RINGERS IV SOLN
INTRAVENOUS | Status: DC
  Administered 2017-06-27 (×2): via INTRAVENOUS

## 2017-06-27 MED ORDER — ROCURONIUM BROMIDE 10 MG/ML (PF) SYRINGE
PREFILLED_SYRINGE | INTRAVENOUS | Status: DC | PRN
  Administered 2017-06-27: 60 mg via INTRAVENOUS

## 2017-06-27 MED ORDER — MEPERIDINE HCL 50 MG/ML IJ SOLN: 6.2500 mg | INTRAMUSCULAR | Status: DC | PRN

## 2017-06-27 MED ORDER — CEFAZOLIN SODIUM-DEXTROSE 2-4 GM/100ML-% IV SOLN
2.0000 g | Freq: Four times a day (QID) | INTRAVENOUS | Status: AC
  Administered 2017-06-27 – 2017-06-28 (×3): 2 g via INTRAVENOUS
  Filled 2017-06-27 (×3): qty 100

## 2017-06-27 MED ORDER — LIDOCAINE 2% (20 MG/ML) 5 ML SYRINGE
INTRAMUSCULAR | Status: DC | PRN
  Administered 2017-06-27: 50 mg via INTRAVENOUS

## 2017-06-27 MED ORDER — NORTRIPTYLINE HCL 25 MG PO CAPS
50.0000 mg | ORAL_CAPSULE | Freq: Every day | ORAL | Status: DC
  Filled 2017-06-27: qty 2

## 2017-06-27 MED ORDER — ONDANSETRON HCL 4 MG/2ML IJ SOLN: 4.0000 mg | Freq: Four times a day (QID) | INTRAMUSCULAR | Status: DC | PRN

## 2017-06-27 MED ORDER — DIAZEPAM 5 MG/ML IJ SOLN: 2.5000 mg | INTRAMUSCULAR | Status: DC | PRN

## 2017-06-27 MED ORDER — LACTATED RINGERS IV SOLN: INTRAVENOUS | Status: DC

## 2017-06-27 MED ORDER — ADULT MULTIVITAMIN W/MINERALS CH
1.0000 | ORAL_TABLET | Freq: Every day | ORAL | Status: DC
  Administered 2017-06-27 – 2017-06-28 (×2): 1 via ORAL
  Filled 2017-06-27 (×3): qty 1

## 2017-06-27 MED ORDER — METOCLOPRAMIDE HCL 5 MG PO TABS: 5.0000 mg | ORAL_TABLET | Freq: Three times a day (TID) | ORAL | Status: DC | PRN

## 2017-06-27 MED ORDER — MIDAZOLAM HCL 2 MG/2ML IJ SOLN
2.0000 mg | Freq: Once | INTRAMUSCULAR | Status: AC
Start: 2017-06-27 — End: 2017-06-27
  Administered 2017-06-27: 2 mg via INTRAVENOUS
  Filled 2017-06-27: qty 2

## 2017-06-27 MED ORDER — PHENYLEPHRINE HCL 10 MG/ML IJ SOLN
INTRAVENOUS | Status: DC | PRN
  Administered 2017-06-27: 60 ug/min via INTRAVENOUS

## 2017-06-27 MED ORDER — SUGAMMADEX SODIUM 200 MG/2ML IV SOLN
INTRAVENOUS | Status: DC | PRN
  Administered 2017-06-27: 200 mg via INTRAVENOUS

## 2017-06-27 MED ORDER — MIDAZOLAM HCL 2 MG/2ML IJ SOLN
INTRAMUSCULAR | Status: AC
  Administered 2017-06-27: 2 mg via INTRAVENOUS
  Filled 2017-06-27: qty 2

## 2017-06-27 MED ORDER — PROPOFOL 10 MG/ML IV BOLUS
INTRAVENOUS | Status: DC | PRN
  Administered 2017-06-27: 140 mg via INTRAVENOUS

## 2017-06-27 MED ORDER — METHOCARBAMOL 500 MG PO TABS: 500.0000 mg | ORAL_TABLET | Freq: Three times a day (TID) | ORAL | 0 refills | Status: DC | PRN

## 2017-06-27 MED ORDER — PROMETHAZINE HCL 25 MG/ML IJ SOLN: 6.2500 mg | INTRAMUSCULAR | Status: DC | PRN

## 2017-06-27 MED ORDER — BUPIVACAINE-EPINEPHRINE (PF) 0.5% -1:200000 IJ SOLN
INTRAMUSCULAR | Status: DC | PRN
  Administered 2017-06-27: 20 mL via PERINEURAL

## 2017-06-27 MED ORDER — ASPIRIN EC 81 MG PO TBEC
81.0000 mg | DELAYED_RELEASE_TABLET | Freq: Every day | ORAL | Status: DC
  Administered 2017-06-27 – 2017-06-28 (×2): 81 mg via ORAL
  Filled 2017-06-27 (×2): qty 1

## 2017-06-27 MED ORDER — ONDANSETRON HCL 4 MG/2ML IJ SOLN
INTRAMUSCULAR | Status: AC
  Filled 2017-06-27: qty 2

## 2017-06-27 MED ORDER — DOUBLE ANTIBIOTIC 500-10000 UNIT/GM EX OINT
TOPICAL_OINTMENT | CUTANEOUS | Status: AC
  Filled 2017-06-27: qty 1

## 2017-06-27 MED ORDER — PHENOL 1.4 % MT LIQD: 1.0000 | OROMUCOSAL | Status: DC | PRN

## 2017-06-27 MED ORDER — HYDROMORPHONE HCL 1 MG/ML IJ SOLN: 0.2500 mg | INTRAMUSCULAR | Status: DC | PRN

## 2017-06-27 MED ORDER — MENTHOL 3 MG MT LOZG: 1.0000 | LOZENGE | OROMUCOSAL | Status: DC | PRN

## 2017-06-27 MED ORDER — DIAZEPAM 5 MG PO TABS: 2.5000 mg | ORAL_TABLET | ORAL | Status: DC | PRN

## 2017-06-27 MED ORDER — ONDANSETRON HCL 4 MG/2ML IJ SOLN
4.0000 mg | Freq: Four times a day (QID) | INTRAMUSCULAR | Status: DC | PRN
  Administered 2017-06-27: 4 mg via INTRAVENOUS

## 2017-06-27 MED ORDER — KETOROLAC TROMETHAMINE 30 MG/ML IJ SOLN
INTRAMUSCULAR | Status: DC | PRN
  Administered 2017-06-27: 30 mg via INTRAVENOUS

## 2017-06-27 MED ORDER — PROPOFOL 10 MG/ML IV BOLUS
INTRAVENOUS | Status: AC
Start: 2017-06-27 — End: 2017-06-27
  Filled 2017-06-27: qty 20

## 2017-06-27 MED ORDER — 0.9 % SODIUM CHLORIDE (POUR BTL) OPTIME
TOPICAL | Status: DC | PRN
  Administered 2017-06-27: 1000 mL

## 2017-06-27 MED ORDER — METOCLOPRAMIDE HCL 5 MG/ML IJ SOLN: 5.0000 mg | Freq: Three times a day (TID) | INTRAMUSCULAR | Status: DC | PRN

## 2017-06-27 MED ORDER — OXYCODONE-ACETAMINOPHEN 5-325 MG PO TABS: 1.0000 | ORAL_TABLET | ORAL | 0 refills | Status: AC | PRN

## 2017-06-27 MED ORDER — ACETAMINOPHEN 325 MG PO TABS: 325.0000 mg | ORAL_TABLET | Freq: Four times a day (QID) | ORAL | Status: DC | PRN

## 2017-06-27 MED ORDER — PHENYLEPHRINE HCL 10 MG/ML IJ SOLN
INTRAMUSCULAR | Status: DC | PRN
  Administered 2017-06-27 (×2): 120 ug via INTRAVENOUS

## 2017-06-27 MED ORDER — SODIUM CHLORIDE 0.9 % IV SOLN
INTRAVENOUS | Status: DC
  Administered 2017-06-27 (×2): via INTRAVENOUS

## 2017-06-27 MED ORDER — DOCUSATE SODIUM 100 MG PO CAPS
100.0000 mg | ORAL_CAPSULE | Freq: Two times a day (BID) | ORAL | Status: DC
  Administered 2017-06-28: 100 mg via ORAL
  Filled 2017-06-27 (×2): qty 1

## 2017-06-27 MED ORDER — BISACODYL 10 MG RE SUPP: 10.0000 mg | Freq: Every day | RECTAL | Status: DC | PRN

## 2017-06-27 MED ORDER — CEFAZOLIN SODIUM-DEXTROSE 2-4 GM/100ML-% IV SOLN
INTRAVENOUS | Status: AC
  Filled 2017-06-27: qty 100

## 2017-06-27 MED ORDER — DEXAMETHASONE SODIUM PHOSPHATE 10 MG/ML IJ SOLN
INTRAMUSCULAR | Status: DC | PRN
  Administered 2017-06-27: 5 mg via INTRAVENOUS

## 2017-06-27 MED ORDER — OXYCODONE HCL 5 MG PO TABS: 5.0000 mg | ORAL_TABLET | ORAL | Status: DC | PRN

## 2017-06-27 SURGICAL SUPPLY — 75 items
BIT DRILL 3.2 (BIT) ×4
BIT DRILL 3.2XCALB NS DISP (BIT) ×2 IMPLANT
BIT DRILL CALIBRATED 2.7 (BIT) ×2 IMPLANT
BIT DRILL CALIBRATED 2.7MM (BIT) ×1
BIT DRL 3.2XCALB NS DISP (BIT) ×2
CLOSURE WOUND 1/2 X4 (GAUZE/BANDAGES/DRESSINGS) ×1
COVER SURGICAL LIGHT HANDLE (MISCELLANEOUS) ×3 IMPLANT
DRAPE INCISE IOBAN 66X45 STRL (DRAPES) ×3 IMPLANT
DRAPE OEC MINIVIEW 54X84 (DRAPES) ×3 IMPLANT
DRAPE U-SHAPE 47X51 STRL (DRAPES) ×3 IMPLANT
DRSG EMULSION OIL 3X3 NADH (GAUZE/BANDAGES/DRESSINGS) ×3 IMPLANT
DRSG MEPILEX BORDER 4X4 (GAUZE/BANDAGES/DRESSINGS) ×3 IMPLANT
DRSG PAD ABDOMINAL 8X10 ST (GAUZE/BANDAGES/DRESSINGS) IMPLANT
ELECT NEEDLE TIP 2.8 STRL (NEEDLE) ×3 IMPLANT
ELECT REM PT RETURN 9FT ADLT (ELECTROSURGICAL) ×3
ELECTRODE REM PT RTRN 9FT ADLT (ELECTROSURGICAL) ×1 IMPLANT
GAUZE SPONGE 4X4 12PLY STRL (GAUZE/BANDAGES/DRESSINGS) IMPLANT
GAUZE SPONGE 4X4 12PLY STRL LF (GAUZE/BANDAGES/DRESSINGS) ×3 IMPLANT
GAUZE XEROFORM 1X8 LF (GAUZE/BANDAGES/DRESSINGS) ×3 IMPLANT
GLOVE BIOGEL PI ORTHO PRO 7.5 (GLOVE)
GLOVE BIOGEL PI ORTHO PRO SZ8 (GLOVE) ×2
GLOVE ORTHO TXT STRL SZ7.5 (GLOVE) IMPLANT
GLOVE PI ORTHO PRO STRL 7.5 (GLOVE) IMPLANT
GLOVE PI ORTHO PRO STRL SZ8 (GLOVE) ×1 IMPLANT
GLOVE SURG ORTHO 8.5 STRL (GLOVE) ×6 IMPLANT
GOWN STRL REUS W/ TWL LRG LVL3 (GOWN DISPOSABLE) ×2 IMPLANT
GOWN STRL REUS W/ TWL XL LVL3 (GOWN DISPOSABLE) ×2 IMPLANT
GOWN STRL REUS W/TWL LRG LVL3 (GOWN DISPOSABLE) ×4
GOWN STRL REUS W/TWL XL LVL3 (GOWN DISPOSABLE) ×4
K-WIRE 2X5 SS THRDED S3 (WIRE) ×6
KIT BASIN OR (CUSTOM PROCEDURE TRAY) ×3 IMPLANT
KIT TURNOVER KIT B (KITS) ×3 IMPLANT
KWIRE 2X5 SS THRDED S3 (WIRE) ×2 IMPLANT
MANIFOLD NEPTUNE II (INSTRUMENTS) IMPLANT
NEEDLE 1/2 CIR MAYO (NEEDLE) IMPLANT
NEEDLE 22X1 1/2 (OR ONLY) (NEEDLE) ×3 IMPLANT
NS IRRIG 1000ML POUR BTL (IV SOLUTION) ×3 IMPLANT
PACK SHOULDER (CUSTOM PROCEDURE TRAY) ×3 IMPLANT
PAD ABD 8X10 STRL (GAUZE/BANDAGES/DRESSINGS) ×3 IMPLANT
PAD ARMBOARD 7.5X6 YLW CONV (MISCELLANEOUS) ×6 IMPLANT
PASSER SUT SWANSON 36MM LOOP (INSTRUMENTS) ×3 IMPLANT
PEG LOCKING 3.2MMX46 (Peg) ×3 IMPLANT
PEG LOCKING 3.2MMX56 (Peg) ×3 IMPLANT
PEG LOCKING 3.2X 60MM (Peg) ×3 IMPLANT
PEG LOCKING 3.2X36 (Screw) ×3 IMPLANT
PEG LOCKING 3.2X40 (Peg) ×6 IMPLANT
PEG LOCKING 3.2X48 (Peg) ×3 IMPLANT
PLATE PROX HUM LO R 4H 83 (Plate) ×3 IMPLANT
SCREW CORTICAL LOW PROF 3.5X32 (Screw) ×3 IMPLANT
SCREW LOCK CORT STAR 3.5X30 (Screw) ×6 IMPLANT
SCREW LOW PROFILE 3.5X30MM TIS (Screw) ×3 IMPLANT
SLEEVE MEASURING 3.2 (BIT) ×3 IMPLANT
SLING ARM FOAM STRAP XLG (SOFTGOODS) IMPLANT
SLING ARM IMMOBILIZER LRG (SOFTGOODS) ×3 IMPLANT
SPONGE LAP 4X18 X RAY DECT (DISPOSABLE) ×3 IMPLANT
STAPLER VISISTAT 35W (STAPLE) ×3 IMPLANT
STRIP CLOSURE SKIN 1/2X4 (GAUZE/BANDAGES/DRESSINGS) ×2 IMPLANT
SUCTION FRAZIER HANDLE 10FR (MISCELLANEOUS) ×2
SUCTION TUBE FRAZIER 10FR DISP (MISCELLANEOUS) ×1 IMPLANT
SUT BONE WAX W31G (SUTURE) IMPLANT
SUT ETHIBOND NAB CT1 #1 30IN (SUTURE) IMPLANT
SUT FIBERWIRE #2 38 T-5 BLUE (SUTURE)
SUT MNCRL AB 4-0 PS2 18 (SUTURE) ×6 IMPLANT
SUT VIC AB 0 CT1 27 (SUTURE) ×2
SUT VIC AB 0 CT1 27XBRD ANBCTR (SUTURE) ×1 IMPLANT
SUT VIC AB 2-0 CT1 27 (SUTURE) ×2
SUT VIC AB 2-0 CT1 TAPERPNT 27 (SUTURE) ×1 IMPLANT
SUT VICRYL 4-0 PS2 18IN ABS (SUTURE) IMPLANT
SUTURE FIBERWR #2 38 T-5 BLUE (SUTURE) IMPLANT
SYR CONTROL 10ML LL (SYRINGE) ×3 IMPLANT
TAPE CLOTH SURG 6X10 WHT LF (GAUZE/BANDAGES/DRESSINGS) ×3 IMPLANT
TOWEL OR 17X24 6PK STRL BLUE (TOWEL DISPOSABLE) IMPLANT
TOWEL OR 17X26 10 PK STRL BLUE (TOWEL DISPOSABLE) ×3 IMPLANT
WATER STERILE IRR 1000ML POUR (IV SOLUTION) ×3 IMPLANT
YANKAUER SUCT BULB TIP NO VENT (SUCTIONS) ×3 IMPLANT

## 2017-06-27 NOTE — H&P (Signed)
ORTHOPAEDIC H&P  REQUESTING PHYSICIAN: Beverely LowNorris, Steve, MD  PCP:  Forrest Moronuehle, Stephen, MD  Chief Complaint: Right shoulder and right elbow pain  HPI: [name] is a 77 y.o. male who complains of right shoulder and right elbow pain after he tripped and fell while doing yard work yesterday.  [facility] revealed a significantly angulated proximal humerus fracture on the right as well is a nondisplaced radial neck fracture.  He was put into a sling.  [facility]  He denies other injuries.  No numbness or tingling.  He does complain of right shoulder and right elbow pain.  Past Medical History:  Diagnosis Date  . Atrial fibrillation (HCC)    history of with cardioversion and no longer A Fib per Pt.  . Coronary artery disease   . Diverticula, colon   . Kidney stone    Past Surgical History:  Procedure Laterality Date  . BICEPS TENDON REPAIR    . CARDIAC SURGERY     valve repair in Feb. 2015  . HEMORROIDECTOMY    . HERNIA REPAIR     Social History   Socioeconomic History  . Marital status: Married    Spouse name: Not on file  . Number of children: Not on file  . Years of education: Not on file  . Highest education level: Not on file  Occupational History  . Not on file  Social Needs  . Financial resource strain: Not on file  . Food insecurity:    Worry: Not on file    Inability: Not on file  . Transportation needs:    Medical: Not on file    Non-medical: Not on file  Tobacco Use  . Smoking status: Never Smoker  . Smokeless tobacco: Never Used  Substance and Sexual Activity  . Alcohol use: No  . Drug use: No  . Sexual activity: Not on file  Lifestyle  . Physical activity:    Days per week: Not on file    Minutes per session: Not on file  . Stress: Not on file  Relationships  . Social connections:    Talks on phone: Not on file    Gets together: Not on file    Attends religious  service: Not on file    Active member of club or organization: Not on file    Attends meetings of clubs or organizations: Not on file    Relationship status: Not on file  Other Topics Concern  . Not on file  Social History Narrative  . Not on file   History reviewed. No pertinent family history. Allergies  Allergen Reactions  . 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 Duratuss G [Guaifenesin]   . Hydrocodone   . Sulfa Drugs Cross Reactors     Swells tongue  . Tetracyclines & Related Rash   Prior to Admission medications   Medication Sig Start Date End Date Taking? Authorizing Provider  aspirin EC 81 MG tablet Take 81 mg by mouth daily.     Yes [provider]  diltiazem (CARDIZEM CD) 180 MG 24 hr capsule Take 180 mg by mouth daily.   Yes [provider]  Multiple Vitamin (MULTIVITAMIN) tablet Take 1 tablet by mouth daily.     Yes [provider]  polycarbophil (FIBERCON) 625 MG tablet Take 625 mg by mouth daily.     Yes  [provider]  amiodarone (PACERONE) 200 MG tablet Take 100 mg by mouth daily.    [provider]  glucosamine-chondroitin 500-400 MG tablet Take 1 tablet by mouth daily.      [provider]  nortriptyline (PAMELOR) 50 MG capsule Take 50 mg by mouth at bedtime.    [provider]  warfarin (COUMADIN) 2.5 MG tablet Take 2.5 mg by mouth daily.    [provider]   Dg Shoulder 1v Right  Result Date: 06/26/2017 CLINICAL DATA:  Fall with pain EXAM: RIGHT SHOULDER - 1 VIEW COMPARISON:  Humerus x-ray 06/26/2017 FINDINGS: Displaced right humeral neck fracture. Right humeral head projects over the glenoid fossa. IMPRESSION: No humeral head dislocation. Acute displaced right femoral neck fracture. Electronically Signed   By: Jasmine Pang M.D.   On: 06/26/2017 18:24   Dg Elbow 2 Views Right  Result Date: 06/26/2017 CLINICAL DATA:  Fall with pain EXAM: RIGHT ELBOW - 2 VIEW  COMPARISON:  None. FINDINGS: No dislocation. Questionable tiny radial head fracture on lateral view. Elbow effusion present IMPRESSION: Elbow effusion with questionable tiny radial head fracture. Electronically Signed   By: Jasmine Pang M.D.   On: 06/26/2017 18:25   Dg Humerus Right  Result Date: 06/26/2017 CLINICAL DATA:  Fall today.  Arm and shoulder pain. EXAM: RIGHT HUMERUS - 2+ VIEW COMPARISON:  None. FINDINGS: A comminuted fracture at the surgical neck of the right humerus demonstrates lateral displacement. The glenohumeral joint is intact. No additional fractures are evident. IMPRESSION: Proximal right humerus fracture at the surgical neck with lateral displacement. Electronically Signed   By: Marin Roberts M.D.   On: 06/26/2017 16:45    Positive ROS: All other systems have been reviewed and were otherwise negative with the exception of those mentioned in the HPI and as above.  Physical Exam: General: Alert, no acute distress Cardiovascular: No pedal edema Respiratory: No cyanosis, no use of accessory musculature GI: No organomegaly, abdomen is soft and non-tender Skin: No lesions in the area of chief complaint Neurologic: Sensation intact distally Psychiatric: Patient is competent for consent with normal mood and affect Lymphatic: No axillary or cervical lymphadenopathy  MUSCULOSKELETAL: Examination of the right upper extremity reveals no skin wounds or lesions.  He does have swelling and deformity to the right shoulder.  He has tenderness to palpation of the shoulder.  He does have tenderness over the radial head.  Distally, he does have radial pulses.  He has positive motor function AIN, PIN, and ulnar motor nerve.  Sensation is intact in the axillary, musculocutaneous, radial, ulnar, and median distributions  Assessment: Displaced right proximal humerus fracture. Nondisplaced right radial neck fracture.  Plan: I discussed the findings with the patient.  We have admitted  him for pain control, possible surgical management.  Dr. Ranell Patrick will evaluate the patient later this morning.  Continue n.p.o.  We will order a CT scan for preop planning.  All questions solicited and answered.    Jonette Pesa, MD Cell [phone]    06/27/2017 12:52 AM

## 2017-06-27 NOTE — Transfer of Care (Signed)
Immediate Anesthesia Transfer of Care Note  Patient: [name]  Procedure(s) Performed: OPEN REDUCTION INTERNAL FIXATION (ORIF) SHOULDER FRACTURE (Right Shoulder)  Patient Location: PACU  Anesthesia Type:General  Level of Consciousness: awake  Airway & Oxygen Therapy: Patient Spontanous Breathing and Patient connected to nasal cannula oxygen  Post-op Assessment: Report given to RN and Post -op Vital signs reviewed and stable  Post vital signs: Reviewed and stable  Last Vitals:  Vitals Value Taken Time  BP 113/71 06/27/2017  3:38 PM  Temp 36.6 C 06/27/2017  3:38 PM  Pulse 80 06/27/2017  3:39 PM  Resp 11 06/27/2017  3:39 PM  SpO2 96 % 06/27/2017  3:39 PM  Vitals shown include unvalidated device data.  Last Pain:  Vitals:   06/27/17 1538  TempSrc:   PainSc: Asleep         Complications: No apparent anesthesia complications

## 2017-06-27 NOTE — Anesthesia Procedure Notes (Signed)
Procedure Name: Intubation Date/Time: 06/27/2017 1:19 PM Performed by: White, Cordella RegisterKelsey Tena Reeve Mallo, CRNA Pre-anesthesia Checklist: Patient identified, Emergency Drugs available, Suction available and Patient being monitored Patient Re-evaluated:Patient Re-evaluated prior to induction Oxygen Delivery Method: Circle System Utilized Preoxygenation: Pre-oxygenation with 100% oxygen Induction Type: IV induction Ventilation: Mask ventilation without difficulty Laryngoscope Size: Glidescope and 4 Grade View: Grade I Tube type: Oral Tube size: 7.5 mm Number of attempts: 1 Airway Equipment and Method: Stylet Placement Confirmation: ETT inserted through vocal cords under direct vision,  positive ETCO2 and breath sounds checked- equal and bilateral Secured at: 25 cm Tube secured with: Tape Dental Injury: Teeth and Oropharynx as per pre-operative assessment  Difficulty Due To: Difficulty was anticipated and Difficult Airway- due to limited oral opening Future Recommendations: Recommend- induction with short-acting agent, and alternative techniques readily available Comments: Elective glidescope due to limited oral opening

## 2017-06-27 NOTE — Anesthesia Preprocedure Evaluation (Addendum)
Anesthesia Evaluation  Patient identified by MRN, date of birth, ID band Patient awake    Reviewed: Allergy & Precautions, NPO status , Patient's Chart, lab work & pertinent test results  Airway Mallampati: III  TM Distance: >3 FB Neck ROM: Full  Mouth opening: Limited Mouth Opening  Dental  (+) Teeth Intact, Dental Advisory Given   Pulmonary neg pulmonary ROS,    breath sounds clear to auscultation       Cardiovascular + CAD  + dysrhythmias Atrial Fibrillation  Rhythm:Regular Rate:Bradycardia     Neuro/Psych negative neurological ROS     GI/Hepatic negative GI ROS, Neg liver ROS,   Endo/Other  negative endocrine ROS  Renal/GU      Musculoskeletal negative musculoskeletal ROS (+)   Abdominal Normal abdominal exam  (+)   Peds  Hematology negative hematology ROS (+)   Anesthesia Other Findings   Reproductive/Obstetrics                            Anesthesia Physical Anesthesia Plan  ASA: III  Anesthesia Plan: General   Post-op Pain Management: GA combined w/ Regional for post-op pain   Induction: Intravenous  PONV Risk Score and Plan: 3 and Ondansetron, Dexamethasone and Midazolam  Airway Management Planned: Oral ETT and Video Laryngoscope Planned  Additional Equipment: None  Intra-op Plan:   Post-operative Plan: Extubation in OR  Informed Consent:   Dental advisory given  Plan Discussed with: CRNA  Anesthesia Plan Comments:        Anesthesia Quick Evaluation

## 2017-06-27 NOTE — Progress Notes (Signed)
Orthopedics Progress Note  Subjective: Patient complaining of severe right shoulder pain and some nausea with pain meds  Objective:  Vitals:   06/27/17 0429 06/27/17 0752  BP: (!) 146/73 (!) 142/60  Pulse: 79 68  Resp:  18  Temp: 98.3 F (36.8 C) 98.3 F (36.8 C)  SpO2: 91% 96%    General: Awake and alert  Musculoskeletal: Severe swelling in the right shoulder girdle area. Compartments supple., less swelling in the forearm and hand. Neurovascularly intact  Lab Results  Component Value Date   WBC 9.3 06/26/2017   HGB 14.7 06/26/2017   HCT 43.5 06/26/2017   MCV 94.4 06/26/2017   PLT 153 06/26/2017       Component Value Date/Time   NA 138 06/26/2017 2223   K 4.1 06/26/2017 2223   CL 105 06/26/2017 2223   CO2 23 06/26/2017 2223   GLUCOSE 137 (H) 06/26/2017 2223   BUN 14 06/26/2017 2223   CREATININE 0.94 06/26/2017 2223   CALCIUM 9.3 06/26/2017 2223   GFRNONAA >60 06/26/2017 2223   GFRAA >60 06/26/2017 2223    Lab Results  Component Value Date   INR 1.09 06/26/2017   INR 1.95 (H) 11/22/2013    Assessment/Plan:  s/p fall with comminuted and displaced right proximal humerus fracture.  Discussed the options for management with the patient and wife and due to ongoing deforming forces across the fracture site we are recommending surgical management. Patient agrees with the plan. Add ZOfran for nausea and IV Valium to control muscle spasms. Plan ORIF later today.  Maintain NPO status  Almedia BallsSteven R. Ranell PatrickNorris, MD 06/27/2017 11:03 AM

## 2017-06-27 NOTE — Progress Notes (Addendum)
1200 Report given to Robert at short stay. NPO post midnight maint. To pre op via bed.  1630 Received pt from PACU, a little sleepy. Denies pain to right shoulder at this time, dressing dry and intact.

## 2017-06-27 NOTE — Op Note (Signed)
[name], [name] NO.:  [id number]  MEDICAL RECORD NO.:  [id number]  LOCATION:  [facility]                         FACILITY:  [facility]  PHYSICIAN:  [name], M.D. DATE OF BIRTH:  1941/01/01  DATE OF PROCEDURE:  06/27/2017 DATE OF DISCHARGE:                              OPERATIVE REPORT   PREOPERATIVE DIAGNOSIS:  Displaced right proximal humerus fracture, closed 2-part fracture.  POSTOPERATIVE DIAGNOSIS:  Displaced right proximal humerus fracture, closed 2-part fracture.  PROCEDURE PERFORMED:  Open reduction and internal fixation of displaced proximal humerus fracture using [employer] ALPS proximal humerus plate.  ATTENDING SURGEON:  [name], M.D.  ASSISTANT:  Richard Martinez, PA-C  ANESTHESIA:  General anesthesia plus interscalene block was used.  ESTIMATED BLOOD LOSS:  100 mL.  FLUID REPLACED:  1200 mL crystalloid.  INSTRUMENT COUNTS:  Correct.  COMPLICATIONS:  None.  ANTIBIOTICS:  Perioperative antibiotics were given.  INDICATIONS:  The patient is a 77 year old male who fell hard onto his right upper extremity scraping his right elbow creating a displaced and comminuted proximal humerus fracture.  The patient presented with severe shoulder pain and elbow pain for evaluation.  X-rays demonstrated a displaced 2-part proximal humerus fracture with significant displacement and translation as well as rotation of the fracture site.  I discussed with the patient that the alignment of the fracture was unacceptable and would need to be reduced and stabilized.  The patient did want to proceed with surgical management.  He does have a nondisplaced radial head/neck fracture that would not require surgery.  The patient also has an abrasion on his elbow that will be cleaned and dressed in the operating room.  Risks and benefits of surgery were discussed.  The patient want to move forward with surgery.  Informed consent obtained.  DESCRIPTION OF  PROCEDURE:  After an adequate level of anesthesia achieved, the patient positioned in the modified beach-chair position. Right shoulder correctly identified, sterilely prepped and draped in usual manner.  A large amount of swelling was identified.  Time-out was called.  We did our approach through the deltopectoral incision, starting at the coracoid process extending down to the anterior humerus. Dissection down through the subcutaneous tissues to the cephalic vein and deltopectoral interval, which was easily identified at the coracoid process and extending distally bluntly with my finger.  Fracture hematoma identified and evacuated.  We identified the fractured humerus, which was just under the deltoid and utilizing Cobb elevator was able to manipulate the fracture and realign the humeral head, which was significantly rotated, and the fracture was displaced anteriorly.  Once we were able to align the fracture properly, we selected the appropriate length ALPS plate by [employer] and placed that lateral to the biceps tendon, which was identified and used as a landmark as was the subscapularis.  Once we had the plate on at the appropriate level, we pinned it with a pin into the head and then brought in the C-arm, which was draped into the field, and with multiplanar C-arm verified, we had correct pin placement, correct plate height and had gotten the patient out of varus and rotational deformity.  Once we were sure the plate was in the  proper position, we placed our nonlocked screw into the plate distally into the elongated hole with the appropriately length screw, and this was a bicortical nonlocked screw.  Once that was affixed, we then placed our first smooth peg, and these were locked pegs into the proximal plate, and we were happy with that position in the length and alignment of the screw.  We checked it on AP and lateral views to make sure we were both aligned on the AP and the lateral.   We then went ahead and filled our total of 7 proximal smooth pegs, and they were oblique pegs proximal and more distal and then 2 kickstand type medial calcar threaded pegs, and those were all in good position as verified on AP and lateral C-arm views.  Two final screws, which were locked screws into the shaft, and once we had the screws in position, got our final x-rays, we were pleased with the alignment of the fracture and the position of the hardware, we were able to rotate the shoulder under fluoro to make sure that all the smooth pegs were well within the bony envelope and none were near the subchondral bone and everything moved together as a unit.  We irrigated thoroughly and then closed in layers with 0 Vicryl for the muscular layer followed by 2-0 Vicryl subcu, and 4-0 Monocryl for skin.  Steri-Strips applied followed by sterile dressing.  Richard MartinezJustin Ollis, PA-C was scrubbed during the entire surgery necessary for satisfactory completion of the surgery, appropriate exposure as well as manipulation of the fracture site and holding the fracture position while we placed the plate.  Once we completed our skin incision, Steri- Strips applied followed by sterile dressing.  The patient placed in a shoulder sling and transported to the recovery room in stable condition.     Richard BallsSteven R. Ranell PatrickNorris, M.D.     SRN/MEDQ  D:  06/27/2017  T:  06/27/2017  Job:  [id number]

## 2017-06-27 NOTE — Anesthesia Postprocedure Evaluation (Signed)
Anesthesia Post Note  Patient: [name]  Procedure(s) Performed: OPEN REDUCTION INTERNAL FIXATION (ORIF) SHOULDER FRACTURE (Right Shoulder)     Patient location during evaluation: PACU Anesthesia Type: General Level of consciousness: awake and alert Pain management: pain level controlled Vital Signs Assessment: post-procedure vital signs reviewed and stable Respiratory status: spontaneous breathing, nonlabored ventilation, respiratory function stable and patient connected to nasal cannula oxygen Cardiovascular status: blood pressure returned to baseline and stable Postop Assessment: no apparent nausea or vomiting Anesthetic complications: no    Last Vitals:  Vitals:   06/27/17 1604 06/27/17 1631  BP:  132/67  Pulse: 77 89  Resp: 15 18  Temp:  36.7 C  SpO2: 94% 97%    Last Pain:  Vitals:   06/27/17 1631  TempSrc: Axillary  PainSc: 0-No pain                 Shelton SilvasKevin D Keontre Defino

## 2017-06-27 NOTE — Brief Op Note (Signed)
06/27/2017  3:35 PM  PATIENT:  [name]  77 y.o. male  PRE-OPERATIVE DIAGNOSIS: Displaced shoulder fracture right  POST-OPERATIVE DIAGNOSIS:  Displaced shoulder fracture right  PROCEDURE:  Procedure(s): OPEN REDUCTION INTERNAL FIXATION (ORIF) SHOULDER FRACTURE (Right) displaced 2 part proximal humerus fracture, closed  SURGEON:  Surgeon(s) and Role:    Beverely Low* Eual Lindstrom, MD - Primary  PHYSICIAN ASSISTANT:   ASSISTANTS: Alfredo MartinezJustin Ollis, PA-C   ANESTHESIA:   regional and general  EBL:  Minimal    BLOOD ADMINISTERED:none  DRAINS: none   LOCAL MEDICATIONS USED:  NONE  SPECIMEN:  No Specimen  DISPOSITION OF SPECIMEN:  N/A  COUNTS:  YES  TOURNIQUET:  * No tourniquets in log *  DICTATION: .Other Dictation: Dictation Number [id number]  PLAN OF CARE: Admit to inpatient   PATIENT DISPOSITION:  PACU - hemodynamically stable.   Delay start of Pharmacological VTE agent (>24hrs) due to surgical blood loss or risk of bleeding: not applicable

## 2017-06-27 NOTE — Anesthesia Procedure Notes (Signed)
Anesthesia Regional Block: Interscalene brachial plexus block   Pre-Anesthetic Checklist: ,, timeout performed, Correct Patient, Correct Site, Correct Laterality, Correct Procedure, Correct Position, site marked, Risks and benefits discussed,  Surgical consent,  Pre-op evaluation,  At surgeon's request and post-op pain management   Prep: chloraprep       Needles:  Injection technique: Single-shot  Needle Type: Echogenic Needle     Needle Length: 9cm  Needle Gauge: 21     Additional Needles:   Procedures:,,,, ultrasound used (permanent image in chart),,,,  Narrative:  Start time: 06/27/2017 12:45 PM End time: 06/27/2017 12:55 PM Injection made incrementally with aspirations every 5 mL.  Performed by: Personally  Anesthesiologist: Shelton SilvasHollis, Kendricks Reap D, MD  Additional Notes: Patient tolerated the procedure well. Local anesthetic introduced in an incremental fashion under minimal resistance after negative aspirations. No paresthesias were elicited. After completion of the procedure, no acute issues were identified and patient continued to be monitored by RN.

## 2017-06-28 MED ORDER — MUPIROCIN 2 % EX OINT
1.0000 "application " | TOPICAL_OINTMENT | Freq: Two times a day (BID) | CUTANEOUS | Status: DC
  Administered 2017-06-28: 1 via NASAL

## 2017-06-28 MED ORDER — CHLORHEXIDINE GLUCONATE CLOTH 2 % EX PADS
6.0000 | MEDICATED_PAD | Freq: Every day | CUTANEOUS | Status: DC
  Administered 2017-06-28: 6 via TOPICAL

## 2017-06-28 NOTE — Evaluation (Signed)
Occupational Therapy Evaluation Patient Details Name: [name] MRN: [id number] DOB: [birth year] Today's Date: 06/28/2017    History of Present Illness Pt is a 77 y.o. male whosustained a fall doing yard work that resulted in R proximal humerus fracture and non-displaced radial neck fracture. Now s/p ORIF R displaced proximal humerus fracture. PMH significant for atrial fibrillation, coronary artery disease, colon diverticula, and kidney stone.   Clinical Impression   PTA, pt was independent with ADL and functional mobility and maintaining an active lifestyle. Pt currently requiring overall supervision for safety with functional mobility, min assist for LB ADL, and mod assist for UB ADL. OT provided education concerning shoulder precautions, compensatory ADL strategies, pendulums and elbow/wrist/hand AROM exercises and pt verbalizes and demonstrates understanding. Feel pt would benefit from continued OT services while admitted and recommend progression of rehabilitation per MD post-acute D/C. Will continue to follow while admitted.    Follow Up Recommendations  Follow surgeon's recommendation for DC plan and follow-up therapies    Equipment Recommendations  None recommended by OT    Recommendations for Other Services       Precautions / Restrictions Precautions Precautions: Shoulder Type of Shoulder Precautions: Conservative protocol: No AROM/PROM shoulder,  Shoulder Interventions: Shoulder sling/immobilizer;Off for dressing/bathing/exercises;At all times Precaution Booklet Issued: Yes (comment) Precaution Comments: Reviewed precautions and compensatory strategies in detail.  Restrictions Weight Bearing Restrictions: Yes RUE Weight Bearing: Non weight bearing      Mobility Bed Mobility Overal bed mobility: Modified Independent                Transfers Overall transfer level: Needs assistance   Transfers: Sit to/from Stand Sit to Stand: Supervision          General transfer comment: Supervision for safety.     Balance Overall balance assessment: No apparent balance deficits (not formally assessed)                                         ADL either performed or assessed with clinical judgement   ADL Overall ADL's : Needs assistance/impaired Eating/Feeding: Set up;Sitting   Grooming: Supervision/safety;Standing   Upper Body Bathing: Moderate assistance;Sitting   Lower Body Bathing: Minimal assistance;Sit to/from stand   Upper Body Dressing : Moderate assistance;Sitting   Lower Body Dressing: Minimal assistance;Sit to/from stand   Toilet Transfer: Supervision/safety;Ambulation;Regular Toilet   Toileting- Architect and Hygiene: Supervision/safety;Sit to/from stand       Functional mobility during ADLs: Supervision/safety General ADL Comments: Pt educated concerning compensatory strategies for dressing and bathing tasks as well as safe positioning for sleep.      Vision Patient Visual Report: No change from baseline Vision Assessment?: No apparent visual deficits     Perception     Praxis      Pertinent Vitals/Pain Pain Assessment: Faces Faces Pain Scale: Hurts little more Pain Location: R shoulder Pain Descriptors / Indicators: Discomfort;Sore("not really pain just soreness and discomfort" per pt) Pain Intervention(s): Limited activity within patient's tolerance     Hand Dominance Right   Extremity/Trunk Assessment Upper Extremity Assessment Upper Extremity Assessment: RUE deficits/detail RUE Deficits / Details: Post-operative "discomfort" with intact sensation and functional elbow/wrist/hand AROM. No ROM of shoulder per protocol.  RUE: Unable to fully assess due to immobilization   Lower Extremity Assessment Lower Extremity Assessment: Overall WFL for tasks assessed       Communication Communication Communication: No difficulties  Cognition Arousal/Alertness:  Awake/alert Behavior During Therapy: WFL for tasks assessed/performed Overall Cognitive Status: Within Functional Limits for tasks assessed                                     General Comments       Exercises     Shoulder Instructions      Home Living Family/patient expects to be discharged to:: Private residence Living Arrangements: Spouse/significant other Available Help at Discharge: Family;Available 24 hours/day Type of Home: House Home Access: Stairs to enter Entergy CorporationEntrance Stairs-Number of Steps: 2   Home Layout: One level     Bathroom Shower/Tub: Producer, television/film/videoWalk-in shower   Bathroom Toilet: Handicapped height(has both)     Home Equipment: Shower seat          Prior Functioning/Environment Level of Independence: Independent                 OT Problem List: Decreased strength;Decreased range of motion;Decreased safety awareness;Decreased knowledge of precautions;Impaired UE functional use      OT Treatment/Interventions: Self-care/ADL training;Therapeutic exercise;Energy conservation;DME and/or AE instruction;Therapeutic activities;Patient/family education;Balance training    OT Goals(Current goals can be found in the care plan section) Acute Rehab OT Goals Patient Stated Goal: go home today OT Goal Formulation: With patient Time For Goal Achievement: 07/12/17 Potential to Achieve Goals: Good ADL Goals Pt Will Perform Grooming: (P) with modified independence;standing Pt Will Perform Upper Body Dressing: (P) with min assist;sitting Pt Will Perform Lower Body Dressing: (P) with modified independence;sit to/from stand Pt Will Transfer to Toilet: (P) with modified independence;ambulating;regular height toilet Pt Will Perform Toileting - Clothing Manipulation and hygiene: (P) with modified independence;sit to/from stand Pt/caregiver will Perform Home Exercise Program: (P) Increased ROM;Right Upper extremity;With written HEP provided(elbow/wrist/hand AROM)   OT Frequency: Min 2X/week   Barriers to D/C:            Co-evaluation              AM-PAC PT "6 Clicks" Daily Activity     Outcome Measure Help from another person eating meals?: A Little Help from another person taking care of personal grooming?: A Little Help from another person toileting, which includes using toliet, bedpan, or urinal?: A Little Help from another person bathing (including washing, rinsing, drying)?: A Lot Help from another person to put on and taking off regular upper body clothing?: A Lot Help from another person to put on and taking off regular lower body clothing?: A Little 6 Click Score: 16   End of Session Equipment Utilized During Treatment: Other (comment)(R shoulder sling) Nurse Communication: Mobility status  Activity Tolerance: Patient tolerated treatment well Patient left: in bed;with call bell/phone within reach  OT Visit Diagnosis: Other abnormalities of gait and mobility (R26.89);Pain Pain - Right/Left: Right Pain - part of body: Shoulder                Time: [id number] OT Time Calculation (min): 24 min Charges:  OT General Charges $OT Visit: 1 Visit OT Evaluation $OT Eval Moderate Complexity: 1 Mod OT Treatments $Self Care/Home Management : 8-22 mins G-Codes:     Doristine Sectionharity A Frank Pilger, MS OTR/L  Pager: [phone]   Fabyan Loughmiller A Candance Bohlman 06/28/2017, 10:12 AM

## 2017-06-28 NOTE — Progress Notes (Signed)
Subjective: 1 Day Post-Op Procedure(s) (LRB): OPEN REDUCTION INTERNAL FIXATION (ORIF) SHOULDER FRACTURE (Right) Patient reports pain as moderate.  Well controlled with oral pain meds.  No n/v.  Tolerating regular diet.  Eager to go home.  Objective: Vital signs in last 24 hours: Temp:  [97.8 F (36.6 C)-98.8 F (37.1 C)] 98.6 F (37 C) (03/31 0348) Pulse Rate:  [63-89] 63 (03/31 0348) Resp:  [10-23] 17 (03/31 0348) BP: (113-134)/(63-71) 132/67 (03/31 0348) SpO2:  [90 %-98 %] 90 % (03/31 0348)  Intake/Output from previous day: 03/30 0701 - 03/31 0700 In: 2000 [I.V.:1800; IV Piggyback:200] Out: 400 [Urine:100; Blood:300] Intake/Output this shift: Total I/O In: 100 [IV Piggyback:100] Out: 350 [Urine:350]  Recent Labs    06/26/17 2223  HGB 14.7   Recent Labs    06/26/17 2223  WBC 9.3  RBC 4.61  HCT 43.5  PLT 153   Recent Labs    06/26/17 2223  NA 138  K 4.1  CL 105  CO2 23  BUN 14  CREATININE 0.94  GLUCOSE 137*  CALCIUM 9.3   Recent Labs    06/26/17 2223  INR 1.09    wn wd male in nad.  R UE incision c/d/i.  steris in place.  No signs of infection. NVI at R UE.  Assessment/Plan: 1 Day Post-Op Procedure(s) (LRB): OPEN REDUCTION INTERNAL FIXATION (ORIF) SHOULDER FRACTURE (Right) D/c home.  Dressing changed.  Toni ArthursHEWITT, [place] Schorr 06/28/2017, 10:05 AM

## 2017-06-28 NOTE — Discharge Instructions (Signed)
Please keep a pillow or blanket propped behind the right elbow to keep the arm across the waist.  Please apply ice to the front of the shoulder to help with pain and swelling  Keep the incision clean and dry and covered for one week, then ok to get it wet in the shower.  Do not lift push or pull or lift with the right arm.  Ok to use the right hand for gentle daily activity and self care.  Do exercises as instructed.  Follow up in the office with Dr Ranell PatrickNorris in two weeks [phone]

## 2017-06-29 ENCOUNTER — Encounter (HOSPITAL_COMMUNITY): Payer: Self-pay | Admitting: Orthopedic Surgery

## 2017-06-29 NOTE — OR Nursing (Signed)
Late entry to to implant correction.

## 2017-06-29 NOTE — Discharge Summary (Signed)
Orthopedic Discharge Summary        Physician Discharge Summary  Patient ID: [name] MRN: [id number] DOB/AGE: 1941-02-02 77 y.o.  Admit date: 06/26/2017 Discharge date: 06/28/2017  Procedures:  Procedure(s) (LRB): OPEN REDUCTION INTERNAL FIXATION (ORIF) SHOULDER FRACTURE (Right)  Attending Physician:  Dr. Malon Kindle  Admission Diagnoses:   Comminuted and displaced right proximal humerus fracture  Discharge Diagnoses:   Comminuted and displaced right proximal humerus fracture   Past Medical History:  Diagnosis Date  . Atrial fibrillation (HCC)    history of with cardioversion and no longer A Fib per Pt.  . Coronary artery disease   . Diverticula, colon   . Kidney stone     PCP: Forrest Moron, MD   Discharged Condition: good  Hospital Course:  Patient underwent the above stated procedure on 06/26/2017. Patient tolerated the procedure well and brought to the recovery room in good condition and subsequently to the floor. Patient had an uncomplicated hospital course and was stable for discharge.   Disposition:  with follow up in 2 weeks   Follow-up Information    Beverely Low, MD. Call in 2 weeks.   Specialty:  Orthopedic Surgery Why:  [phone] Contact information: [address] [place] [address] [place] [address] [phone]           Discharge Instructions    Call MD / Call 911   Complete by:  As directed    If you experience chest pain or shortness of breath, CALL 911 and be transported to the hospital emergency room.  If you develope a fever above 101 F, pus (white drainage) or increased drainage or redness at the wound, or calf pain, call your surgeon's office.   Constipation Prevention   Complete by:  As directed    Drink plenty of fluids.  Prune juice may be helpful.  You may use a stool softener, such as Colace (over the counter) 100 mg twice a day.  Use MiraLax (over the counter) for constipation as needed.   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   alum hydroxide-mag trisilicate 80-20 MG Chew chewable tablet Commonly known as:  GAVISCON Chew 2 tablets by mouth as needed for indigestion or heartburn.   amiodarone 200 MG tablet Commonly known as:  PACERONE Take 100 mg by mouth daily.   aspirin EC 81 MG tablet Take 81 mg by mouth daily.   diltiazem 180 MG 24 hr capsule Commonly known as:  CARDIZEM CD Take 180 mg by mouth daily.   fluticasone 50 MCG/ACT nasal spray Commonly known as:  FLONASE Place 1 spray into the nose at bedtime.   glucosamine-chondroitin 500-400 MG tablet Take 1 tablet by mouth daily.   ibuprofen 200 MG  tablet Commonly known as:  ADVIL,MOTRIN Take 400 mg by mouth as needed for pain.   methocarbamol 500 MG tablet Commonly known as:  ROBAXIN Take 1 tablet (500 mg total) by mouth every 8 (eight) hours as needed.   multivitamin tablet Take 1 tablet by mouth daily.   nortriptyline 50 MG capsule Commonly known as:  PAMELOR Take 50 mg by mouth at bedtime.   oxyCODONE-acetaminophen 5-325 MG tablet Commonly known as:  PERCOCET Take 1-2 tablets by mouth every 4 (four) hours as needed for severe pain.   pantoprazole 40 MG tablet Commonly known as:  PROTONIX Take 40 mg by mouth 2 (two) times daily.   polycarbophil 625 MG tablet Commonly known as:  FIBERCON Take 625 mg by mouth daily.          Signed: Verlee RossettiORRIS,STEVEN R 06/29/2017, 10:20 AM  [facility]  [place] [address], [place], [place] [address] Phone: [phone] Facebook  Instagram  [employer]In  Twitter

## 2017-07-14 ENCOUNTER — Ambulatory Visit: Payer: Medicare Other | Attending: Orthopedic Surgery | Admitting: Physical Therapy

## 2017-07-14 ENCOUNTER — Encounter: Payer: Self-pay | Admitting: Physical Therapy

## 2017-07-14 DIAGNOSIS — M25511 Pain in right shoulder: Secondary | ICD-10-CM | POA: Diagnosis not present

## 2017-07-14 DIAGNOSIS — R600 Localized edema: Secondary | ICD-10-CM | POA: Diagnosis present

## 2017-07-14 DIAGNOSIS — M25611 Stiffness of right shoulder, not elsewhere classified: Secondary | ICD-10-CM

## 2017-07-14 NOTE — Therapy (Signed)
[facility] [place] [address] [place], [place], [address] Phone: [phone]   Fax:  [phone]  Physical Therapy Evaluation  Patient Details  Name: [name] MRN: [id number] Date of Birth: [birth year] Referring Provider: Dietrich Pates   Encounter Date: 07/14/2017  PT End of Session - 07/14/17 1525    Visit Number  1    Date for PT Re-Evaluation  09/13/17    PT Start Time  1440    PT Stop Time  1525    PT Time Calculation (min)  45 min    Activity Tolerance  Patient tolerated treatment well    Behavior During Therapy  [facility] for tasks assessed/performed       Past Medical History:  Diagnosis Date  . Atrial fibrillation (HCC)    history of with cardioversion and no longer A Fib per Pt.  . Coronary artery disease   . Diverticula, colon   . Kidney stone     Past Surgical History:  Procedure Laterality Date  . BICEPS TENDON REPAIR    . CARDIAC SURGERY     valve repair in Feb. 2015  . HEMORROIDECTOMY    . HERNIA REPAIR    . ORIF SHOULDER FRACTURE Right 06/27/2017   Procedure: OPEN REDUCTION INTERNAL FIXATION (ORIF) SHOULDER FRACTURE;  Surgeon: Beverely Low, MD;  Location: [facility];  Service: Orthopedics;  Laterality: Right;    There were no vitals filed for this visit.   Subjective Assessment - 07/14/17 1444    Subjective  Patient reports a fall on 06/26/17.  He sustained a proximal humerus fracture.  He underwent a right ORIF of the proximal humerus on 06/17/17.  He comes in wearing sling, has been doing wlbow and wrist exercises.    Limitations  Lifting;House hold activities    Patient Stated Goals  have no pain, normal ROM and strength    Currently in Pain?  Yes    Pain Score  0-No pain    Pain Location  Wrist    Pain Orientation  Right    Pain Descriptors / Indicators  Aching;Sore    Pain Type  Acute pain;Surgical pain    Pain Radiating Towards  pain is mostly in the right elbow and wrist    Pain Onset  1 to 4  weeks ago    Pain Frequency  Intermittent    Aggravating Factors   movements, reports that hte swelling of the wrist and hand have been the biggest issue with pain up to 6/10 with motions    Pain Relieving Factors  rest and in sling    Effect of Pain on Daily Activities  limits everything         [facility] PT Assessment - 07/14/17 0001      Assessment   Medical Diagnosis  s/p right ORIF of the proximal humerus    Referring Provider  S. Norris    Onset Date/Surgical Date  06/27/17    Hand Dominance  Right    Prior Therapy  no      Precautions   Precautions  None      Balance Screen   Has the patient fallen in the past 6 months  Yes    How many times?  1    Has the patient had a decrease in activity level because of a fear of falling?   No    Is the patient reluctant to leave their home because of a fear of falling?  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, miningstructural engineering    Leisure  no exercise      Observation/Other Assessments-Edema    Edema  -- a lot of edema and bruising in the right arm      Posture/Postural Control   Posture Comments  fwd head and rounded shoulders      ROM / Strength   AROM / PROM / Strength  AROM;PROM      AROM   Overall AROM Comments  this was done AAROM to his pain level, elbow motions are WFL's, wrist and fingers are stiff and painful due to swelling,     AROM Assessment Site  Shoulder    Right/Left Shoulder  Right    Right Shoulder Flexion  87 Degrees    Right Shoulder Internal Rotation  20 Degrees    Right Shoulder External Rotation  20 Degrees      PROM   Overall PROM Comments  all PROM increased pain    PROM Assessment Site  Shoulder    Right/Left Shoulder  Right    Right Shoulder Flexion  95 Degrees    Right Shoulder Internal Rotation  30 Degrees    Right Shoulder  External Rotation  40 Degrees      Palpation   Palpation comment  incision is tight and tender, there is ecchymosis from the shoulder to the finger tips, swelling is present from shoulder to finger tips                Objective measurements completed on examination: See above findings.              PT Education - 07/14/17 1524    Education provided  Yes    Education Details  started PROM/AAROM of the right shoulder    Person(s) Educated  Patient    Methods  Explanation;Demonstration;Verbal cues;Tactile cues;Handout    Comprehension  Returned demonstration;Verbalized understanding;Tactile cues required       PT Short Term Goals - 07/14/17 1531      PT SHORT TERM GOAL #1   Title  independent with initial HEP    Time  2    Period  Weeks    Status  New        PT Long Term Goals - 07/14/17 1531      PT LONG TERM GOAL #1   Title  decrease pain 50%    Time  8    Period  Weeks    Status  New      PT LONG TERM GOAL #2   Title  increase AROM of the right shoulder to 150 degrees flexion    Time  8    Period  Weeks    Status  New      PT LONG TERM GOAL #3   Title  increase AROM of the right shoulder ER/IR to 60 degrees    Time  8    Period  Weeks    Status  New      PT LONG TERM GOAL #4   Title  report no difficulty dressing or doing hair    Time  8    Period  Weeks    Status  New  Plan - 07/14/17 1525    Clinical Impression Statement  Patient fell on 06/26/17 sustaining a right proximal humerus fracture, he underwent a right ORIF of the proximal humerus on 06/27/17.  He has significant swelling and bruising in the upper ar and the hand, he reports wrist and hand pain from the swelling.  His order is for gentle AAROM.  He did have a hard time relaxing and allowing PROM.      Clinical Presentation  Evolving    Clinical Decision Making  Low    Rehab Potential  Good    PT Frequency  2x / week    PT Duration  8 weeks    PT  Treatment/Interventions  ADLs/Self Care Home Management;Cryotherapy;Electrical Stimulation;Therapeutic exercise;Therapeutic activities;Patient/family education;Manual techniques;Passive range of motion;Scar mobilization;Vasopneumatic Device    PT Next Visit Plan  continue with PROM/AAROM    Consulted and Agree with Plan of Care  Patient       Patient will benefit from skilled therapeutic intervention in order to improve the following deficits and impairments:  Decreased range of motion, Impaired UE functional use, Increased muscle spasms, Pain, Decreased scar mobility, Decreased strength, Increased edema  Visit Diagnosis: Acute pain of right shoulder - Plan: PT plan of care cert/re-cert  Stiffness of right shoulder, not elsewhere classified - Plan: PT plan of care cert/re-cert  Localized edema - Plan: PT plan of care cert/re-cert     Problem List Patient Active Problem List   Diagnosis Date Noted  . 2-part displaced fracture of surgical neck of right humerus 06/27/2017  . 2-part displaced fracture of surgical neck of right humerus, initial encounter for closed fracture 06/26/2017  . Humerus fracture 06/26/2017    Jearld Lesch., PT 07/14/2017, 3:54 PM  [facility] [address] [place], [place], [address] Phone: [phone]   Fax:  [phone]  Name: [name] MRN: [id number] Date of Birth: 08-21-1940

## 2017-07-28 ENCOUNTER — Encounter: Payer: Self-pay | Admitting: Physical Therapy

## 2017-07-28 ENCOUNTER — Ambulatory Visit: Payer: Medicare Other | Admitting: Physical Therapy

## 2017-07-28 DIAGNOSIS — R600 Localized edema: Secondary | ICD-10-CM

## 2017-07-28 DIAGNOSIS — M25511 Pain in right shoulder: Secondary | ICD-10-CM | POA: Diagnosis not present

## 2017-07-28 DIAGNOSIS — M25611 Stiffness of right shoulder, not elsewhere classified: Secondary | ICD-10-CM

## 2017-07-28 NOTE — Therapy (Signed)
[place], [place], [address] Phone: [phone]   Fax:  [phone]  Physical Therapy Treatment  Patient Details  Name: [name] MRN: [id number] Date of Birth: 04-Jan-1941 Referring Provider: Gilberto Better   Encounter Date: 07/28/2017  PT End of Session - 07/28/17 1005    Visit Number  2    Date for PT Re-Evaluation  09/13/17    PT Start Time  0930    PT Stop Time  1020    PT Time Calculation (min)  50 min    Activity Tolerance  Patient tolerated treatment well    Behavior During Therapy  [facility] for tasks assessed/performed       Past Medical History:  Diagnosis Date  . Atrial fibrillation ([place])    history of with cardioversion and no longer A Fib per Pt.  . Coronary artery disease   . Diverticula, colon   . Kidney stone     Past Surgical History:  Procedure Laterality Date  . BICEPS TENDON REPAIR    . CARDIAC SURGERY     valve repair in Feb. 2015  . HEMORROIDECTOMY    . HERNIA REPAIR    . ORIF SHOULDER FRACTURE Right 06/27/2017   Procedure: OPEN REDUCTION INTERNAL FIXATION (ORIF) SHOULDER FRACTURE;  Surgeon: Netta Cedars, MD;  Location: [place];  Service: Orthopedics;  Laterality: Right;    There were no vitals filed for this visit.  Subjective Assessment - 07/28/17 0933    Subjective  Pt reports compliance with HEP. Pt reports no change since evaluation    Currently in Pain?  No/denies    Pain Score  0-No pain                       [facility] Adult PT Treatment/Exercise - 07/28/17 0001      Exercises   Exercises  Shoulder      Shoulder Exercises: Standing   External Rotation  10 reps;Theraband;Both;Strengthening x2    Internal Rotation  AAROM;10 reps cane up back    Flexion  10 reps;AAROM cane x2    Extension  AAROM;Right;10 reps x2, cane    Row  12 reps;Theraband;Both x2    Other Standing Exercises  Tband ext red 2x12    Other Standing Exercises  Tband ER  2x10       Shoulder Exercises: ROM/Strengthening   UBE (Upper Arm Bike)  L1 8fd/3rev      Modalities   Modalities  Cryotherapy      Cryotherapy   Number Minutes Cryotherapy  10 Minutes    Cryotherapy Location  Shoulder    Type of Cryotherapy  Ice pack      Manual Therapy   Manual Therapy  Joint mobilization;Soft tissue mobilization;Passive ROM    Manual therapy comments  Some mild guarding with flex,abd, and scaption    Joint Mobilization  GH grades 1-2 R shoulder    Soft tissue mobilization  R anterior deltoid    Passive ROM  R shoulder all directions               PT Short Term Goals - 07/28/17 0936      PT SHORT TERM GOAL #1   Title  independent with initial HEP    Status  Achieved        PT Long Term Goals - 07/28/17 0936      PT LONG TERM GOAL #4   Title  report  no difficulty dressing or doing hair    Status  Partially Met            Plan - 07/28/17 1006    Clinical Impression Statement  Pt tolerated an initial progression to therapeutic exercises well. He does reports some pain at the end rang of PROM. During MT some difficulty relaxing at times and guarded with some motions. L shoulder ER/IR is well, pt is limited with flex, abd, and scaption.    Rehab Potential  Good    PT Frequency  2x / week    PT Duration  8 weeks    PT Treatment/Interventions  ADLs/Self Care Home Management;Cryotherapy;Electrical Stimulation;Therapeutic exercise;Therapeutic activities;Patient/family education;Manual techniques;Passive range of motion;Scar mobilization;Vasopneumatic Device    PT Next Visit Plan  continue with PROM/AAROM       Patient will benefit from skilled therapeutic intervention in order to improve the following deficits and impairments:  Decreased range of motion, Impaired UE functional use, Increased muscle spasms, Pain, Decreased scar mobility, Decreased strength, Increased edema  Visit Diagnosis: Acute pain of right shoulder  Stiffness of right  shoulder, not elsewhere classified  Localized edema     Problem List Patient Active Problem List   Diagnosis Date Noted  . 2-part displaced fracture of surgical neck of right humerus 06/27/2017  . 2-part displaced fracture of surgical neck of right humerus, initial encounter for closed fracture 06/26/2017  . Humerus fracture 06/26/2017    Scot Jun, PTA 07/28/2017, 10:09 AM  [place], [place], [address] Phone: [phone]   Fax:  [phone]  Name: [name] MRN: [id number] Date of Birth: 04/08/1940

## 2017-07-30 ENCOUNTER — Encounter: Payer: Self-pay | Admitting: Physical Therapy

## 2017-07-30 ENCOUNTER — Ambulatory Visit: Payer: Medicare Other | Attending: Orthopedic Surgery | Admitting: Physical Therapy

## 2017-07-30 DIAGNOSIS — M25611 Stiffness of right shoulder, not elsewhere classified: Secondary | ICD-10-CM | POA: Insufficient documentation

## 2017-07-30 DIAGNOSIS — R600 Localized edema: Secondary | ICD-10-CM | POA: Insufficient documentation

## 2017-07-30 DIAGNOSIS — M25511 Pain in right shoulder: Secondary | ICD-10-CM | POA: Diagnosis not present

## 2017-07-30 NOTE — Therapy (Signed)
[place], [place], [address] Phone: [phone]   Fax:  [phone]  Physical Therapy Treatment  Patient Details  Name: [name] MRN: [id number] Date of Birth: November 15, 1940 Referring Provider: Gilberto Better   Encounter Date: 07/30/2017  PT End of Session - 07/30/17 1013    Visit Number  3    Date for PT Re-Evaluation  09/13/17    PT Start Time  0927    PT Stop Time  1021    PT Time Calculation (min)  54 min    Activity Tolerance  Patient tolerated treatment well    Behavior During Therapy  [facility] for tasks assessed/performed       Past Medical History:  Diagnosis Date  . Atrial fibrillation ([place])    history of with cardioversion and no longer A Fib per Pt.  . Coronary artery disease   . Diverticula, colon   . Kidney stone     Past Surgical History:  Procedure Laterality Date  . BICEPS TENDON REPAIR    . CARDIAC SURGERY     valve repair in Feb. 2015  . HEMORROIDECTOMY    . HERNIA REPAIR    . ORIF SHOULDER FRACTURE Right 06/27/2017   Procedure: OPEN REDUCTION INTERNAL FIXATION (ORIF) SHOULDER FRACTURE;  Surgeon: Netta Cedars, MD;  Location: [place];  Service: Orthopedics;  Laterality: Right;    There were no vitals filed for this visit.  Subjective Assessment - 07/30/17 0927    Subjective  Pt reports no pain only stiffness. Reports improved mobility when doing his HEP after last session.     Currently in Pain?  No/denies    Pain Score  0-No pain                       [facility] Adult PT Treatment/Exercise - 07/30/17 0001      Shoulder Exercises: Standing   External Rotation  10 reps;Both;Strengthening;20 reps;Theraband    Theraband Level (Shoulder External Rotation)  Level 2 (Red)    Internal Rotation  AAROM;Right;20 reps up back    Flexion  AAROM;Right;20 reps    Extension  AAROM;Right;20 reps;Theraband    Theraband Level (Shoulder Extension)  Level 2 (Red)    Row   Theraband;Both;10 reps;Strengthening x2    Theraband Level (Shoulder Row)  Level 2 (Red)    Other Standing Exercises  Tband ext red 2x10    Other Standing Exercises  Tband ER 2x10; 1lb flex 2x10      Shoulder Exercises: ROM/Strengthening   UBE (Upper Arm Bike)  L3 76fd/3rev      Modalities   Modalities  Vasopneumatic      Cryotherapy   Number Minutes Cryotherapy  15 Minutes      Vasopneumatic   Number Minutes Vasopneumatic   15 minutes    Vasopnuematic Location   Shoulder    Vasopneumatic Pressure  Medium    Vasopneumatic Temperature   32      Manual Therapy   Manual Therapy  Joint mobilization;Soft tissue mobilization;Passive ROM    Manual therapy comments  Some mild guarding with flex,abd, and scaption    Joint Mobilization  GH grades 1-2 R shoulder    Soft tissue mobilization  R anterior deltoid    Passive ROM  R shoulder all directions               PT Short Term Goals - 07/28/17 04270  PT SHORT TERM GOAL #1   Title  independent with initial HEP    Status  Achieved        PT Long Term Goals - 07/28/17 0936      PT LONG TERM GOAL #4   Title  report no difficulty dressing or doing hair    Status  Partially Met            Plan - 07/30/17 1015    Clinical Impression Statement  Pt did well with today's activity, Some shoulder elevation with shoulder flexion and abduction. Guarded initially with MT but did relax some as MT progressed. Reports some pain at end range of flex, scap, and abd. More so pain with abduction. Bo pain reported after MT.     Rehab Potential  Good    PT Frequency  2x / week    PT Duration  8 weeks    PT Treatment/Interventions  ADLs/Self Care Home Management;Cryotherapy;Electrical Stimulation;Therapeutic exercise;Therapeutic activities;Patient/family education;Manual techniques;Passive range of motion;Scar mobilization;Vasopneumatic Device    PT Next Visit Plan  continue with PROM/AAROM       Patient will benefit from skilled  therapeutic intervention in order to improve the following deficits and impairments:  Decreased range of motion, Impaired UE functional use, Increased muscle spasms, Pain, Decreased scar mobility, Decreased strength, Increased edema  Visit Diagnosis: Acute pain of right shoulder  Stiffness of right shoulder, not elsewhere classified  Localized edema     Problem List Patient Active Problem List   Diagnosis Date Noted  . 2-part displaced fracture of surgical neck of right humerus 06/27/2017  . 2-part displaced fracture of surgical neck of right humerus, initial encounter for closed fracture 06/26/2017  . Humerus fracture 06/26/2017    Scot Jun, PTA 07/30/2017, 10:17 AM  [place] [address] [place], [place], [address] Phone: [phone]   Fax:  [phone]  Name: [name] MRN: [id number] Date of Birth: 1940/09/23

## 2017-08-03 ENCOUNTER — Encounter: Payer: Self-pay | Admitting: Physical Therapy

## 2017-08-03 ENCOUNTER — Ambulatory Visit: Payer: Medicare Other | Admitting: Physical Therapy

## 2017-08-03 DIAGNOSIS — M25511 Pain in right shoulder: Secondary | ICD-10-CM | POA: Diagnosis not present

## 2017-08-03 DIAGNOSIS — M25611 Stiffness of right shoulder, not elsewhere classified: Secondary | ICD-10-CM

## 2017-08-03 NOTE — Therapy (Signed)
[facility] [place] [facility] [address] [place], [place], [address] Phone: [phone]   Fax:  [phone]  Physical Therapy Treatment  Patient Details  Name: [name] MRN: [id number] Date of Birth: 1940-10-21 Referring Provider: Dietrich Pates   Encounter Date: 08/03/2017  PT End of Session - 08/03/17 1648    Visit Number  4    Date for PT Re-Evaluation  09/13/17    PT Start Time  1616    PT Stop Time  1700    PT Time Calculation (min)  44 min    Activity Tolerance  Patient tolerated treatment well    Behavior During Therapy  [facility] for tasks assessed/performed       Past Medical History:  Diagnosis Date  . Atrial fibrillation (HCC)    history of with cardioversion and no longer A Fib per Pt.  . Coronary artery disease   . Diverticula, colon   . Kidney stone     Past Surgical History:  Procedure Laterality Date  . BICEPS TENDON REPAIR    . CARDIAC SURGERY     valve repair in Feb. 2015  . HEMORROIDECTOMY    . HERNIA REPAIR    . ORIF SHOULDER FRACTURE Right 06/27/2017   Procedure: OPEN REDUCTION INTERNAL FIXATION (ORIF) SHOULDER FRACTURE;  Surgeon: Beverely Low, MD;  Location: [facility] OR;  Service: Orthopedics;  Laterality: Right;    There were no vitals filed for this visit.  Subjective Assessment - 08/03/17 1618    Subjective  Patient reports that he is doing well, has a sharp bite when using the mouse on the computer, mostly in the lateral upper arm.    Currently in Pain?  No/denies    Aggravating Factors   pain up to 7-8/10 when reaching out    Pain Relieving Factors  no pain at rest         [facility] PT Assessment - 08/03/17 0001      AROM   Right Shoulder Flexion  110 Degrees    Right Shoulder Internal Rotation  40 Degrees    Right Shoulder External Rotation  80 Degrees      PROM   Right Shoulder Flexion  120 Degrees    Right Shoulder Internal Rotation  45 Degrees    Right Shoulder External Rotation  80 Degrees                    [facility] Adult PT Treatment/Exercise - 08/03/17 0001      Shoulder Exercises: Standing   Other Standing Exercises  Tband ext red 2x10      Shoulder Exercises: ROM/Strengthening   UBE (Upper Arm Bike)  L3 86frd/3rev      Shoulder Exercises: Isometric Strengthening   Flexion  5X10"    Extension  5X10"    External Rotation  5X10"    Internal Rotation  5X10"    ABduction  5X10"    ADduction  5X10"      Shoulder Exercises: Stretch   Star Gazer Stretch  2 reps;10 seconds      Manual Therapy   Manual Therapy  Joint mobilization;Soft tissue mobilization;Passive ROM    Joint Mobilization  GH grades 1-2 R shoulder    Passive ROM  R shoulder all directions               PT Short Term Goals - 07/28/17 0936      PT SHORT TERM GOAL #1   Title  independent  with initial HEP    Status  Achieved        PT Long Term Goals - 08/03/17 1651      PT LONG TERM GOAL #1   Title  decrease pain 50%    Status  On-going      PT LONG TERM GOAL #2   Title  increase AROM of the right shoulder to 150 degrees flexion    Status  On-going      PT LONG TERM GOAL #3   Title  increase AROM of the right shoulder ER/IR to 60 degrees    Status  On-going      PT LONG TERM GOAL #4   Title  report no difficulty dressing or doing hair    Status  On-going            Plan - 08/03/17 1649    Clinical Impression Statement  Today is visit 4.  He has made great improvements over the past 3 weeks with both AROM and PROM.  He c/o lateral right upper arm pain with small reaching like using the mouse and eating, reports a "bite" up to 8/10 on pain scale.  He has been able to tolerate PROM and exercises without much difficulty.    PT Next Visit Plan  Please advise of any restrictions, as well as when to start strength.  Thank you    Consulted and Agree with Plan of Care  Patient       Patient will benefit from skilled therapeutic intervention in order to improve the  following deficits and impairments:  Decreased range of motion, Impaired UE functional use, Increased muscle spasms, Pain, Decreased scar mobility, Decreased strength, Increased edema  Visit Diagnosis: Acute pain of right shoulder  Stiffness of right shoulder, not elsewhere classified     Problem List Patient Active Problem List   Diagnosis Date Noted  . 2-part displaced fracture of surgical neck of right humerus 06/27/2017  . 2-part displaced fracture of surgical neck of right humerus, initial encounter for closed fracture 06/26/2017  . Humerus fracture 06/26/2017    Jearld Lesch., PT 08/03/2017, 4:52 PM  [facility] [place] [address] [place], [place], [address] Phone: [phone]   Fax:  [phone]  Name: [name] MRN: [id number] Date of Birth: November 17, 1940

## 2017-08-06 ENCOUNTER — Ambulatory Visit: Payer: Medicare Other | Admitting: Physical Therapy

## 2017-08-06 DIAGNOSIS — M25511 Pain in right shoulder: Secondary | ICD-10-CM | POA: Diagnosis not present

## 2017-08-06 DIAGNOSIS — M25611 Stiffness of right shoulder, not elsewhere classified: Secondary | ICD-10-CM

## 2017-08-06 NOTE — Patient Instructions (Signed)
Strengthening: Resisted Flexion   Hold tubing with left arm at side. Pull forward and up. Move shoulder through pain-free range of motion. Repeat ____ times per set. Do ____ sets per session. Do ____ sessions per day.  http://orth.exer.us/824   Copyright  VHI. All rights reserved.  Strengthening: Resisted Extension   Hold tubing in right hand, arm forward. Pull arm back, elbow straight. Repeat ____ times per set. Do ____ sets per session. Do ____ sessions per day.  http://orth.exer.us/832   Copyright  VHI. All rights reserved.    Low Row: Single Arm   Face anchor in stride stance. Palm up, pull arm back while squeezing shoulder blades together. Repeat 10__ times per set. Repeat with other arm. Do _2-3_ sets per session. Do 3__ sessions per week. Anchor Height: Waist  http://tub.exer.us/71   Copyright  VHI. All rights reserved.  Press: Thumb Up (Single Arm)   Face away from anchor in stride stance, leg forward opposite exercising arm. Press arm forward with thumb up. Repeat 10 times per set.  Do _2-3_ sets per session. Do _3_ sessions per week. Anchor Height: Chest  http://tub.exer.us/17   Copyright  VHI. All rights reserved.  Rotation: External (Single Arm)   Side toward anchor in shoulder width stance with elbow bent to 90, arm across mid-section. Thumb up, pull arm away from body, keeping elbow bent. Repeat _10_ times per set. Repeat with other arm. Do _2-3_ sets per session. Do _3_ sessions per week. Anchor Height: Waist  http://tub.exer.us/115   Copyright  VHI. All rights reserved.  Rotation: Internal (Single Arm)   Side toward anchor in shoulder width stance with elbow bent to 90, forearm away from body. Thumb up, pull arm across body keeping elbow bent. Repeat _10_ times per set. Repeat with other arm. Do _2-3_ sets per session. Do _3_ sessions per week. Anchor Height: Waist  http://tub.exer.us/123   Copyright  VHI. All rights reserved.    Solon Palm, PT 08/06/17 9:21 AM

## 2017-08-06 NOTE — Therapy (Signed)
[facility] [address] [place], [place], [address] Phone: [phone]   Fax:  [phone]  Physical Therapy Treatment  Patient Details  Name: [name] MRN: [id number] Date of Birth: January 01, 1941 Referring Provider: Dietrich Pates   Encounter Date: 08/06/2017  PT End of Session - 08/06/17 0853    Visit Number  5    Date for PT Re-Evaluation  09/13/17    PT Start Time  0850    PT Stop Time  0930    PT Time Calculation (min)  40 min    Activity Tolerance  Patient tolerated treatment well    Behavior During Therapy  [facility] for tasks assessed/performed       Past Medical History:  Diagnosis Date  . Atrial fibrillation (HCC)    history of with cardioversion and no longer A Fib per Pt.  . Coronary artery disease   . Diverticula, colon   . Kidney stone     Past Surgical History:  Procedure Laterality Date  . BICEPS TENDON REPAIR    . CARDIAC SURGERY     valve repair in Feb. 2015  . HEMORROIDECTOMY    . HERNIA REPAIR    . ORIF SHOULDER FRACTURE Right 06/27/2017   Procedure: OPEN REDUCTION INTERNAL FIXATION (ORIF) SHOULDER FRACTURE;  Surgeon: Beverely Low, MD;  Location: [facility] OR;  Service: Orthopedics;  Laterality: Right;    There were no vitals filed for this visit.  Subjective Assessment - 08/06/17 0853    Subjective  Patient has no new complaints. Only feels pain with certain movements at 2-4/10.    Patient Stated Goals  have no pain, normal ROM and strength    Currently in Pain?  No/denies                       [facility] Adult PT Treatment/Exercise - 08/06/17 0001      Shoulder Exercises: Supine   Diagonals  AROM;Right;10 reps D2 flex/ext      Shoulder Exercises: Standing   Protraction  Strengthening;Right;20 reps;Theraband    Theraband Level (Shoulder Protraction)  Level 2 (Red)    External Rotation  Strengthening;Right;20 reps;Theraband    Theraband Level (Shoulder External Rotation)  Level 2  (Red)    Internal Rotation  Strengthening;Right;20 reps;Theraband    Theraband Level (Shoulder Internal Rotation)  Level 2 (Red)    Flexion  Strengthening;Right;20 reps;Theraband    Theraband Level (Shoulder Flexion)  Level 2 (Red)    Extension  Strengthening;Both;20 reps;Theraband;Right    Theraband Level (Shoulder Extension)  Level 2 (Red)    Row  Strengthening;Both;20 reps;Theraband    Theraband Level (Shoulder Row)  Level 2 (Red)      Shoulder Exercises: ROM/Strengthening   UBE (Upper Arm Bike)  L3 17frd/3rev      Shoulder Exercises: Isometric Strengthening   Flexion  5X10"    Extension  5X10"    External Rotation  5X10"    Internal Rotation  5X10"    ABduction  5X10"    ADduction  5X10"      Shoulder Exercises: Stretch   External Rotation Stretch  2 reps;20 seconds    Wall Stretch - Flexion  5 reps;10 seconds    Wall Stretch - ABduction  3 reps;10 seconds scaption             PT Education - 08/06/17 1151    Education provided  Yes    Education Details  HEP  Person(s) Educated  Patient    Methods  Explanation;Demonstration;Handout;Verbal cues    Comprehension  Verbalized understanding;Returned demonstration       PT Short Term Goals - 07/28/17 0936      PT SHORT TERM GOAL #1   Title  independent with initial HEP    Status  Achieved        PT Long Term Goals - 08/03/17 1651      PT LONG TERM GOAL #1   Title  decrease pain 50%    Status  On-going      PT LONG TERM GOAL #2   Title  increase AROM of the right shoulder to 150 degrees flexion    Status  On-going      PT LONG TERM GOAL #3   Title  increase AROM of the right shoulder ER/IR to 60 degrees    Status  On-going      PT LONG TERM GOAL #4   Title  report no difficulty dressing or doing hair    Status  On-going            Plan - 08/06/17 1151    Clinical Impression Statement  Patient with NO with no restrictions. He tolerated strengthening well without increased pain. Patient will be  gone for one week and then return for therapy.    Rehab Potential  Good    PT Frequency  2x / week    PT Duration  8 weeks    PT Treatment/Interventions  ADLs/Self Care Home Management;Cryotherapy;Electrical Stimulation;Therapeutic exercise;Therapeutic activities;Patient/family education;Manual techniques;Passive range of motion;Scar mobilization;Vasopneumatic Device    PT Next Visit Plan  Review rockwood 4 and progress strengthening; PNF    PT Home Exercise Plan  Rockwood 4, rows, ext; supine D2 flex/ext    Consulted and Agree with Plan of Care  Patient       Patient will benefit from skilled therapeutic intervention in order to improve the following deficits and impairments:  Decreased range of motion, Impaired UE functional use, Increased muscle spasms, Pain, Decreased scar mobility, Decreased strength, Increased edema  Visit Diagnosis: Acute pain of right shoulder  Stiffness of right shoulder, not elsewhere classified     Problem List Patient Active Problem List   Diagnosis Date Noted  . 2-part displaced fracture of surgical neck of right humerus 06/27/2017  . 2-part displaced fracture of surgical neck of right humerus, initial encounter for closed fracture 06/26/2017  . Humerus fracture 06/26/2017    Breea Loncar PT 08/06/2017, 11:54 AM  [facility] [address] [place], [place], [address] Phone: [phone]   Fax:  [phone]  Name: [name] MRN: [id number] Date of Birth: [birth year]

## 2017-08-18 ENCOUNTER — Ambulatory Visit: Payer: Medicare Other | Admitting: Physical Therapy

## 2017-08-18 ENCOUNTER — Encounter: Payer: Self-pay | Admitting: Physical Therapy

## 2017-08-18 DIAGNOSIS — R600 Localized edema: Secondary | ICD-10-CM

## 2017-08-18 DIAGNOSIS — M25611 Stiffness of right shoulder, not elsewhere classified: Secondary | ICD-10-CM

## 2017-08-18 DIAGNOSIS — M25511 Pain in right shoulder: Secondary | ICD-10-CM

## 2017-08-18 NOTE — Therapy (Signed)
[facility] [address] [place], [place], [address] Phone: [phone]   Fax:  [phone]  Physical Therapy Treatment  Patient Details  Name: [name] MRN: [id number] Date of Birth: [birth year] Referring Provider: Dietrich Pates   Encounter Date: 08/18/2017  PT End of Session - 08/18/17 0926    Visit Number  6    Date for PT Re-Evaluation  09/13/17    PT Start Time  0838    PT Stop Time  0924    PT Time Calculation (min)  46 min    Activity Tolerance  Patient tolerated treatment well    Behavior During Therapy  [facility] for tasks assessed/performed       Past Medical History:  Diagnosis Date  . Atrial fibrillation (HCC)    history of with cardioversion and no longer A Fib per Pt.  . Coronary artery disease   . Diverticula, colon   . Kidney stone     Past Surgical History:  Procedure Laterality Date  . BICEPS TENDON REPAIR    . CARDIAC SURGERY     valve repair in Feb. 2015  . HEMORROIDECTOMY    . HERNIA REPAIR    . ORIF SHOULDER FRACTURE Right 06/27/2017   Procedure: OPEN REDUCTION INTERNAL FIXATION (ORIF) SHOULDER FRACTURE;  Surgeon: Beverely Low, MD;  Location: [facility] OR;  Service: Orthopedics;  Laterality: Right;    There were no vitals filed for this visit.  Subjective Assessment - 08/18/17 0844    Subjective  Patient reports doing better.  Reports he still is getting a sharp pain up to 8/10 with eating, "everytime I eat"    Currently in Pain?  No/denies                       [facility] Adult PT Treatment/Exercise - 08/18/17 0001      Shoulder Exercises: Standing   Other Standing Exercises  6# ball overhead carry, wall push ups      Shoulder Exercises: ROM/Strengthening   UBE (Upper Arm Bike)  L5 60frd/3rev    Lat Pull  2 plate;20 reps    Cybex Press  1.5 plate;20 reps    Cybex Row  1.5 plate;20 reps    Wall Pushups  20 reps;Limitations    Wall Pushups Limitations  small motions    Other  ROM/Strengthening Exercises  weighted ball throwing, 2# and 3# cabinet reaching    Other ROM/Strengthening Exercises  biceps 10#, triceps 25#      Shoulder Exercises: Isometric Strengthening   Other Isometric Exercises  use of body blade all motions      Shoulder Exercises: Stretch   Star Gazer Stretch  2 reps;10 seconds      Manual Therapy   Passive ROM  R shoulder all directions use of some contract relax               PT Short Term Goals - 07/28/17 0936      PT SHORT TERM GOAL #1   Title  independent with initial HEP    Status  Achieved        PT Long Term Goals - 08/03/17 1651      PT LONG TERM GOAL #1   Title  decrease pain 50%    Status  On-going      PT LONG TERM GOAL #2   Title  increase AROM of the right shoulder to 150 degrees flexion    Status  On-going      PT LONG TERM GOAL #3   Title  increase AROM of the right shoulder ER/IR to 60 degrees    Status  On-going      PT LONG TERM GOAL #4   Title  report no difficulty dressing or doing hair    Status  On-going            Plan - 08/18/17 0926    Clinical Impression Statement  Patient with pain with horizontal adduction and IR stretches, he was able to reach overhead with 3# but fatigued easily.  Continues to c/o the most pain with eating, c/o a sharp pain.    PT Next Visit Plan  continue to progress ROM and strength for better function    Consulted and Agree with Plan of Care  Patient       Patient will benefit from skilled therapeutic intervention in order to improve the following deficits and impairments:  Decreased range of motion, Impaired UE functional use, Increased muscle spasms, Pain, Decreased scar mobility, Decreased strength, Increased edema  Visit Diagnosis: Acute pain of right shoulder  Stiffness of right shoulder, not elsewhere classified  Localized edema     Problem List Patient Active Problem List   Diagnosis Date Noted  . 2-part displaced fracture of surgical neck  of right humerus 06/27/2017  . 2-part displaced fracture of surgical neck of right humerus, initial encounter for closed fracture 06/26/2017  . Humerus fracture 06/26/2017    Richard Lesch., PT 08/18/2017, 9:28 AM  [facility] [address] [place], [place], [address] Phone: [phone]   Fax:  [phone]  Name: [name] MRN: [id number] Date of Birth: January 09, 1941

## 2017-08-20 ENCOUNTER — Ambulatory Visit: Payer: Medicare Other | Admitting: Physical Therapy

## 2017-08-20 ENCOUNTER — Encounter: Payer: Self-pay | Admitting: Physical Therapy

## 2017-08-20 DIAGNOSIS — M25511 Pain in right shoulder: Secondary | ICD-10-CM

## 2017-08-20 DIAGNOSIS — R600 Localized edema: Secondary | ICD-10-CM

## 2017-08-20 DIAGNOSIS — M25611 Stiffness of right shoulder, not elsewhere classified: Secondary | ICD-10-CM

## 2017-08-20 NOTE — Therapy (Signed)
[place], [place], [address] Phone: [phone]   Fax:  [phone]  Physical Therapy Treatment  Patient Details  Name: [name] MRN: [id number] Date of Birth: Jul 13, 1940 Referring Provider: Gilberto Better   Encounter Date: 08/20/2017  PT End of Session - 08/20/17 0922    Visit Number  7    Date for PT Re-Evaluation  09/13/17    PT Start Time  0840    PT Stop Time  0929    PT Time Calculation (min)  49 min    Activity Tolerance  Patient tolerated treatment well    Behavior During Therapy  [facility] for tasks assessed/performed       Past Medical History:  Diagnosis Date  . Atrial fibrillation ([place])    history of with cardioversion and no longer A Fib per Pt.  . Coronary artery disease   . Diverticula, colon   . Kidney stone     Past Surgical History:  Procedure Laterality Date  . BICEPS TENDON REPAIR    . CARDIAC SURGERY     valve repair in Feb. 2015  . HEMORROIDECTOMY    . HERNIA REPAIR    . ORIF SHOULDER FRACTURE Right 06/27/2017   Procedure: OPEN REDUCTION INTERNAL FIXATION (ORIF) SHOULDER FRACTURE;  Surgeon: Netta Cedars, MD;  Location: [place];  Service: Orthopedics;  Laterality: Right;    There were no vitals filed for this visit.  Subjective Assessment - 08/20/17 0844    Subjective  Patient reports that he was very sore after the last treatment    Currently in Pain?  Yes    Pain Score  3     Pain Location  Shoulder    Pain Orientation  Right    Pain Descriptors / Indicators  Sore    Aggravating Factors   exercises made me very sore                       [facility] Adult PT Treatment/Exercise - 08/20/17 0001      Shoulder Exercises: Standing   External Rotation  Strengthening;Right;20 reps;Theraband    Theraband Level (Shoulder External Rotation)  Level 2 (Red)    Other Standing Exercises  3# bent over row.  Small weighted ball rebounder toss      Shoulder  Exercises: ROM/Strengthening   UBE (Upper Arm Bike)  L5 73fd/3rev    Lat Pull  2 plate;20 reps    Cybex Press  1.5 plate;20 reps    Cybex Row  1.5 plate;20 reps    Wall Wash  flwxion, scaption, CW/CCW      Shoulder Exercises: SIT sales professional 4 reps;10 seconds    Star Gazer Stretch  2 reps;10 seconds      Modalities   Modalities  ETeacher, English as a foreign languageLocation  right shoulder    Electrical Stimulation Action  IFC    Electrical Stimulation Parameters  sitting    Electrical Stimulation Goals  Pain      Vasopneumatic   Number Minutes Vasopneumatic   15 minutes    Vasopnuematic Location   Shoulder    Vasopneumatic Pressure  Medium    Vasopneumatic Temperature   32               PT Short Term Goals - 07/28/17 0936      PT SHORT TERM GOAL #1  Title  independent with initial HEP    Status  Achieved        PT Long Term Goals - 08/20/17 0924      PT LONG TERM GOAL #1   Title  decrease pain 50%    Status  Partially Met      PT LONG TERM GOAL #4   Title  report no difficulty dressing or doing hair    Status  Partially Met            Plan - 08/20/17 0923    Clinical Impression Statement  Patient very sore with exercises that were added last visit.  We backed down some tolerated well but still very sore    PT Next Visit Plan  continue to progress ROM and strength for better function    Consulted and Agree with Plan of Care  Patient       Patient will benefit from skilled therapeutic intervention in order to improve the following deficits and impairments:  Decreased range of motion, Impaired UE functional use, Increased muscle spasms, Pain, Decreased scar mobility, Decreased strength, Increased edema  Visit Diagnosis: Acute pain of right shoulder  Stiffness of right shoulder, not elsewhere classified  Localized edema     Problem List Patient Active Problem List   Diagnosis Date Noted   . 2-part displaced fracture of surgical neck of right humerus 06/27/2017  . 2-part displaced fracture of surgical neck of right humerus, initial encounter for closed fracture 06/26/2017  . Humerus fracture 06/26/2017    Sumner Boast., PT 08/20/2017, 9:25 AM  [address] [address] [place], [place], [address] Phone: [phone]   Fax:  [phone]  Name: [name] MRN: [id number] Date of Birth: 08-17-1940

## 2017-08-26 ENCOUNTER — Ambulatory Visit: Payer: Medicare Other | Admitting: Physical Therapy

## 2017-08-26 ENCOUNTER — Encounter: Payer: Self-pay | Admitting: Physical Therapy

## 2017-08-26 DIAGNOSIS — M25611 Stiffness of right shoulder, not elsewhere classified: Secondary | ICD-10-CM

## 2017-08-26 DIAGNOSIS — R600 Localized edema: Secondary | ICD-10-CM

## 2017-08-26 DIAGNOSIS — M25511 Pain in right shoulder: Secondary | ICD-10-CM | POA: Diagnosis not present

## 2017-08-26 NOTE — Therapy (Signed)
[place], [place], [address] Phone: [phone]   Fax:  [phone]  Physical Therapy Treatment  Patient Details  Name: Davaun Quintela MRN: [id number] Date of Birth: Nov 12, 1940 Referring Provider: Gilberto Better   Encounter Date: 08/26/2017  PT End of Session - 08/26/17 0832    Visit Number  8    Date for PT Re-Evaluation  09/13/17    PT Start Time  0746    PT Stop Time  0845    PT Time Calculation (min)  59 min    Activity Tolerance  Patient tolerated treatment well    Behavior During Therapy  [facility] for tasks assessed/performed       Past Medical History:  Diagnosis Date  . Atrial fibrillation ([place])    history of with cardioversion and no longer A Fib per Pt.  . Coronary artery disease   . Diverticula, colon   . Kidney stone     Past Surgical History:  Procedure Laterality Date  . BICEPS TENDON REPAIR    . CARDIAC SURGERY     valve repair in Feb. 2015  . HEMORROIDECTOMY    . HERNIA REPAIR    . ORIF SHOULDER FRACTURE Right 06/27/2017   Procedure: OPEN REDUCTION INTERNAL FIXATION (ORIF) SHOULDER FRACTURE;  Surgeon: Netta Cedars, MD;  Location: [place];  Service: Orthopedics;  Laterality: Right;    There were no vitals filed for this visit.  Subjective Assessment - 08/26/17 0753    Subjective  Patient reports that eating still causes the most sharp pain, reports that also he has pain with most any motions    Currently in Pain?  Yes    Pain Score  1     Pain Location  Shoulder    Pain Orientation  Right    Pain Descriptors / Indicators  Tightness    Aggravating Factors   eating, fishing, reaching out and back                       [facility] Adult PT Treatment/Exercise - 08/26/17 0001      Shoulder Exercises: Standing   External Rotation  Strengthening;Right;20 reps;Theraband    Theraband Level (Shoulder External Rotation)  Level 2 (Red)    Other Standing Exercises  wand  GH motions all with PT overpressure at end range    Other Standing Exercises  3# bent over row.  Small weighted ball rebounder toss      Shoulder Exercises: ROM/Strengthening   UBE (Upper Arm Bike)  L5 39fd/3rev    Lat Pull  2 plate;20 reps    Cybex Press  1.5 plate;20 reps;Limitations    Cybex Press Limitations  all exercises cause pain, worse with the chest press anterior shoulder    Cybex Row  1.5 plate;20 reps    Wall Wash  flwxion, scaption, CW/CCW    Other ROM/Strengthening Exercises  biceps 10#, triceps 25#      Vasopneumatic   Number Minutes Vasopneumatic   15 minutes    Vasopnuematic Location   Shoulder    Vasopneumatic Pressure  Medium    Vasopneumatic Temperature   32      Manual Therapy   Manual Therapy  Joint mobilization;Soft tissue mobilization;Passive ROM    Passive ROM  R shoulder all directions use of some contract relax, end range stretches               PT Short Term Goals -  07/28/17 0936      PT SHORT TERM GOAL #1   Title  independent with initial HEP    Status  Achieved        PT Long Term Goals - 08/26/17 0834      PT LONG TERM GOAL #4   Title  report no difficulty dressing or doing hair    Status  Partially Met            Plan - 08/26/17 0832    Clinical Impression Statement  Patient has some discomfort/tightness wiht all exercises, had the most pain with chest press, he has his worst pain with easy activities like eating and reaching to rub eye, reports it is usually when he is coming down, seems like impingment with pain more with some eccentric motions, ROM remains very good    PT Next Visit Plan  continue to progress ROM and strength for better function    Consulted and Agree with Plan of Care  Patient       Patient will benefit from skilled therapeutic intervention in order to improve the following deficits and impairments:  Decreased range of motion, Impaired UE functional use, Increased muscle spasms, Pain, Decreased scar  mobility, Decreased strength, Increased edema  Visit Diagnosis: Acute pain of right shoulder  Stiffness of right shoulder, not elsewhere classified  Localized edema     Problem List Patient Active Problem List   Diagnosis Date Noted  . 2-part displaced fracture of surgical neck of right humerus 06/27/2017  . 2-part displaced fracture of surgical neck of right humerus, initial encounter for closed fracture 06/26/2017  . Humerus fracture 06/26/2017    Sumner Boast., PT 08/26/2017, 8:34 AM  [place] [address] [place], [place], [address] Phone: [phone]   Fax:  [phone]  Name: Simran Mannis MRN: [id number] Date of Birth: 09-16-1940

## 2017-09-01 ENCOUNTER — Encounter: Payer: Self-pay | Admitting: Physical Therapy

## 2017-09-01 ENCOUNTER — Ambulatory Visit: Payer: Medicare Other | Attending: Orthopedic Surgery | Admitting: Physical Therapy

## 2017-09-01 DIAGNOSIS — R600 Localized edema: Secondary | ICD-10-CM | POA: Insufficient documentation

## 2017-09-01 DIAGNOSIS — M25511 Pain in right shoulder: Secondary | ICD-10-CM | POA: Diagnosis not present

## 2017-09-01 DIAGNOSIS — M25611 Stiffness of right shoulder, not elsewhere classified: Secondary | ICD-10-CM | POA: Diagnosis present

## 2017-09-01 NOTE — Therapy (Addendum)
[place], [place], [address] Phone: [phone]   Fax:  [phone]  Physical Therapy Treatment  Patient Details  Name: [name] MRN: [id number] Date of Birth: 07-30-1940 Referring Provider: Gilberto Better   Encounter Date: 09/01/2017  PT End of Session - 09/01/17 1038    Visit Number  9    Date for PT Re-Evaluation  09/13/17    PT Start Time  0844    PT Stop Time  0933    PT Time Calculation (min)  49 min    Activity Tolerance  Patient tolerated treatment well    Behavior During Therapy  [facility] for tasks assessed/performed       Past Medical History:  Diagnosis Date  . Atrial fibrillation ([place])    history of with cardioversion and no longer A Fib per Pt.  . Coronary artery disease   . Diverticula, colon   . Kidney stone     Past Surgical History:  Procedure Laterality Date  . BICEPS TENDON REPAIR    . CARDIAC SURGERY     valve repair in Feb. 2015  . HEMORROIDECTOMY    . HERNIA REPAIR    . ORIF SHOULDER FRACTURE Right 06/27/2017   Procedure: OPEN REDUCTION INTERNAL FIXATION (ORIF) SHOULDER FRACTURE;  Surgeon: Netta Cedars, MD;  Location: [place];  Service: Orthopedics;  Laterality: Right;    There were no vitals filed for this visit.  Subjective Assessment - 09/01/17 0847    Subjective  Patient continues to report pain on a daily basis and with most motions    Currently in Pain?  Yes    Pain Score  2     Pain Location  Shoulder    Pain Orientation  Right    Aggravating Factors   just "about any motion" causes pain         [facility] PT Assessment - 09/01/17 0001      AROM   Right/Left Shoulder  Right    Right Shoulder Flexion  120 Degrees    Right Shoulder ABduction  118 Degrees    Right Shoulder Internal Rotation  48 Degrees    Right Shoulder External Rotation  90 Degrees      PROM   Right Shoulder Flexion  140 Degrees    Right Shoulder Internal Rotation  55 Degrees                    [facility] Adult PT Treatment/Exercise - 09/01/17 0001      Shoulder Exercises: ROM/Strengthening   UBE (Upper Arm Bike)  L5 48fd/3rev    Lat Pull  2 plate;20 reps    Cybex Press  1.5 plate;20 reps;Limitations    Cybex Row  20 reps;2 plate    Wall Pushups  15 reps    Other ROM/Strengthening Exercises  biceps 10#, triceps 25#      Modalities   Modalities  Ultrasound;Iontophoresis      Ultrasound   Ultrasound Location  right lateral shoulder    Ultrasound Parameters  100% 1.5w/cm2    Ultrasound Goals  Pain      Iontophoresis   Type of Iontophoresis  Dexamethasone    Location  right lateral shoulder    Dose  862m   Time  4 hour patch      Manual Therapy   Manual Therapy  Joint mobilization;Soft tissue mobilization;Passive ROM    Passive ROM  R shoulder all directions use of  some contract relax, end range stretches               PT Short Term Goals - 07/28/17 0936      PT SHORT TERM GOAL #1   Title  independent with initial HEP    Status  Achieved        PT Long Term Goals - 09/01/17 1041      PT LONG TERM GOAL #1   Title  decrease pain 50%    Status  Partially Met      PT LONG TERM GOAL #2   Title  increase AROM of the right shoulder to 150 degrees flexion    Status  Partially Met      PT LONG TERM GOAL #3   Title  increase AROM of the right shoulder ER/IR to 60 degrees    Status  Partially Met            Plan - 09/01/17 1039    Clinical Impression Statement  Continues to have the pain with easy motions, he has some tenderness in the tendons of the deltoid.  Does well with exercises, he is gaining ROM.  Tried some antiinflammatory treatments to see if this could help    PT Next Visit Plan  see if ionto/US helped pain    Consulted and Agree with Plan of Care  Patient       Patient will benefit from skilled therapeutic intervention in order to improve the following deficits and impairments:  Decreased range of motion,  Impaired UE functional use, Increased muscle spasms, Pain, Decreased scar mobility, Decreased strength, Increased edema  Visit Diagnosis: Acute pain of right shoulder  Stiffness of right shoulder, not elsewhere classified  Localized edema     Problem List Patient Active Problem List   Diagnosis Date Noted  . 2-part displaced fracture of surgical neck of right humerus 06/27/2017  . 2-part displaced fracture of surgical neck of right humerus, initial encounter for closed fracture 06/26/2017  . Humerus fracture 06/26/2017   PHYSICAL THERAPY DISCHARGE SUMMARY  Visits from Start of Care: 9   Plan: Patient agrees to discharge.  Patient goals were partially met. Patient is being discharged due to meeting the stated rehab goals.  ?????      Sumner Boast., PT 09/01/2017, 10:42 AM  [place] [address] [place], [place], [address] Phone: [phone]   Fax:  [phone]  Name: [name] MRN: [id number] Date of Birth: 10/28/1940

## 2017-09-02 ENCOUNTER — Encounter: Payer: Self-pay | Admitting: Physical Therapy

## 2017-09-08 ENCOUNTER — Telehealth: Payer: Self-pay | Admitting: Physical Therapy

## 2017-09-10 ENCOUNTER — Ambulatory Visit: Payer: Medicare Other | Admitting: Physical Therapy

## 2017-09-16 ENCOUNTER — Ambulatory Visit: Payer: Medicare Other | Admitting: Physical Therapy

## 2019-07-02 ENCOUNTER — Emergency Department (HOSPITAL_BASED_OUTPATIENT_CLINIC_OR_DEPARTMENT_OTHER): Payer: Medicare PPO

## 2019-07-02 ENCOUNTER — Other Ambulatory Visit: Payer: Self-pay

## 2019-07-02 ENCOUNTER — Encounter (HOSPITAL_BASED_OUTPATIENT_CLINIC_OR_DEPARTMENT_OTHER): Payer: Self-pay

## 2019-07-02 ENCOUNTER — Inpatient Hospital Stay (HOSPITAL_BASED_OUTPATIENT_CLINIC_OR_DEPARTMENT_OTHER)
Admission: EM | Admit: 2019-07-02 | Discharge: 2019-07-04 | DRG: 392 | Disposition: A | Discharge status: Home / Self Care | Payer: Medicare PPO | Attending: Internal Medicine | Admitting: Internal Medicine

## 2019-07-02 DIAGNOSIS — F509 Eating disorder, unspecified: Secondary | ICD-10-CM | POA: Diagnosis present

## 2019-07-02 DIAGNOSIS — Z888 Allergy status to other drugs, medicaments and biological substances status: Secondary | ICD-10-CM

## 2019-07-02 DIAGNOSIS — Z7982 Long term (current) use of aspirin: Secondary | ICD-10-CM

## 2019-07-02 DIAGNOSIS — K5710 Diverticulosis of small intestine without perforation or abscess without bleeding: Secondary | ICD-10-CM | POA: Diagnosis present

## 2019-07-02 DIAGNOSIS — N200 Calculus of kidney: Secondary | ICD-10-CM

## 2019-07-02 DIAGNOSIS — I2510 Atherosclerotic heart disease of native coronary artery without angina pectoris: Secondary | ICD-10-CM | POA: Diagnosis present

## 2019-07-02 DIAGNOSIS — K5792 Diverticulitis of intestine, part unspecified, without perforation or abscess without bleeding: Secondary | ICD-10-CM | POA: Diagnosis present

## 2019-07-02 DIAGNOSIS — I480 Paroxysmal atrial fibrillation: Secondary | ICD-10-CM | POA: Diagnosis present

## 2019-07-02 DIAGNOSIS — I4891 Unspecified atrial fibrillation: Secondary | ICD-10-CM | POA: Diagnosis present

## 2019-07-02 DIAGNOSIS — R1032 Left lower quadrant pain: Principal | ICD-10-CM | POA: Diagnosis present

## 2019-07-02 DIAGNOSIS — Z882 Allergy status to sulfonamides status: Secondary | ICD-10-CM

## 2019-07-02 DIAGNOSIS — D696 Thrombocytopenia, unspecified: Secondary | ICD-10-CM | POA: Diagnosis present

## 2019-07-02 DIAGNOSIS — Z79899 Other long term (current) drug therapy: Secondary | ICD-10-CM

## 2019-07-02 DIAGNOSIS — E869 Volume depletion, unspecified: Secondary | ICD-10-CM | POA: Diagnosis present

## 2019-07-02 DIAGNOSIS — R109 Unspecified abdominal pain: Secondary | ICD-10-CM | POA: Diagnosis present

## 2019-07-02 DIAGNOSIS — Z885 Allergy status to narcotic agent status: Secondary | ICD-10-CM

## 2019-07-02 DIAGNOSIS — Z7901 Long term (current) use of anticoagulants: Secondary | ICD-10-CM

## 2019-07-02 DIAGNOSIS — Z20822 Contact with and (suspected) exposure to covid-19: Secondary | ICD-10-CM | POA: Diagnosis present

## 2019-07-02 DIAGNOSIS — R509 Fever, unspecified: Secondary | ICD-10-CM

## 2019-07-02 DIAGNOSIS — K760 Fatty (change of) liver, not elsewhere classified: Secondary | ICD-10-CM | POA: Diagnosis present

## 2019-07-02 LAB — URINALYSIS, MICROSCOPIC (REFLEX)

## 2019-07-02 LAB — CBC WITH DIFFERENTIAL/PLATELET
Abs Immature Granulocytes: 0.02 10*3/uL (ref 0.00–0.07)
Basophils Absolute: 0 10*3/uL (ref 0.0–0.1)
Basophils Relative: 0 %
Eosinophils Absolute: 0.1 10*3/uL (ref 0.0–0.5)
Eosinophils Relative: 1 %
HCT: 40.2 % (ref 39.0–52.0)
Hemoglobin: 13.3 g/dL (ref 13.0–17.0)
Immature Granulocytes: 0 %
Lymphocytes Relative: 11 %
Lymphs Abs: 0.6 10*3/uL — ABNORMAL LOW (ref 0.7–4.0)
MCH: 29.6 pg (ref 26.0–34.0)
MCHC: 33.1 g/dL (ref 30.0–36.0)
MCV: 89.5 fL (ref 80.0–100.0)
Monocytes Absolute: 0.6 10*3/uL (ref 0.1–1.0)
Monocytes Relative: 11 %
Neutro Abs: 4.4 10*3/uL (ref 1.7–7.7)
Neutrophils Relative %: 77 %
Platelets: 157 10*3/uL (ref 150–400)
RBC: 4.49 MIL/uL (ref 4.22–5.81)
RDW: 14.7 % (ref 11.5–15.5)
WBC: 5.6 10*3/uL (ref 4.0–10.5)
nRBC: 0 % (ref 0.0–0.2)

## 2019-07-02 LAB — COMPREHENSIVE METABOLIC PANEL
ALT: 18 U/L (ref 0–44)
AST: 22 U/L (ref 15–41)
Albumin: 3.7 g/dL (ref 3.5–5.0)
Alkaline Phosphatase: 63 U/L (ref 38–126)
Anion gap: 9 (ref 5–15)
BUN: 18 mg/dL (ref 8–23)
CO2: 24 mmol/L (ref 22–32)
Calcium: 8.6 mg/dL — ABNORMAL LOW (ref 8.9–10.3)
Chloride: 103 mmol/L (ref 98–111)
Creatinine, Ser: 1.02 mg/dL (ref 0.61–1.24)
GFR calc Af Amer: >60 mL/min (ref >60)
GFR calc non Af Amer: >60 mL/min (ref >60)
Glucose, Bld: 129 mg/dL — ABNORMAL HIGH (ref 70–99)
Potassium: 3.8 mmol/L (ref 3.5–5.1)
Sodium: 136 mmol/L (ref 135–145)
Total Bilirubin: 1.1 mg/dL (ref 0.3–1.2)
Total Protein: 6.6 g/dL (ref 6.5–8.1)

## 2019-07-02 LAB — URINALYSIS, ROUTINE W REFLEX MICROSCOPIC
Bilirubin Urine: NEGATIVE
Glucose, UA: NEGATIVE mg/dL
Hgb urine dipstick: NEGATIVE
Ketones, ur: 15 mg/dL — AB
Leukocytes,Ua: NEGATIVE
Nitrite: NEGATIVE
Protein, ur: 30 mg/dL — AB
Specific Gravity, Urine: 1.015 (ref 1.005–1.030)
pH: 5.5 (ref 5.0–8.0)

## 2019-07-02 LAB — PROTIME-INR
INR: 1.6 — ABNORMAL HIGH (ref 0.8–1.2)
Prothrombin Time: 19.3 seconds — ABNORMAL HIGH (ref 11.4–15.2)

## 2019-07-02 LAB — RESPIRATORY PANEL BY RT PCR (FLU A&B, COVID)
Influenza A by PCR: NEGATIVE
Influenza B by PCR: NEGATIVE
SARS Coronavirus 2 by RT PCR: NEGATIVE

## 2019-07-02 LAB — LIPASE, BLOOD: Lipase: 122 U/L — ABNORMAL HIGH (ref 11–51)

## 2019-07-02 LAB — LACTIC ACID, PLASMA: Lactic Acid, Venous: 0.9 mmol/L (ref 0.5–1.9)

## 2019-07-02 MED ORDER — ACETAMINOPHEN 325 MG PO TABS
650.0000 mg | ORAL_TABLET | Freq: Once | ORAL | Status: AC
  Administered 2019-07-02: 650 mg via ORAL
  Filled 2019-07-02: qty 2

## 2019-07-02 MED ORDER — VANCOMYCIN HCL 1500 MG/300ML IV SOLN
1500.0000 mg | Freq: Once | INTRAVENOUS | Status: AC
  Administered 2019-07-02: 1500 mg via INTRAVENOUS
  Filled 2019-07-02: qty 300

## 2019-07-02 MED ORDER — VANCOMYCIN HCL IN DEXTROSE 1-5 GM/200ML-% IV SOLN
INTRAVENOUS | Status: AC
  Filled 2019-07-02: qty 200

## 2019-07-02 MED ORDER — SODIUM CHLORIDE 0.9 % IV SOLN: 2.0000 g | Freq: Once | INTRAVENOUS | Status: DC

## 2019-07-02 MED ORDER — SODIUM CHLORIDE 0.9 % IV BOLUS
500.0000 mL | Freq: Once | INTRAVENOUS | Status: AC
  Administered 2019-07-02: 500 mL via INTRAVENOUS

## 2019-07-02 MED ORDER — VANCOMYCIN HCL IN DEXTROSE 1-5 GM/200ML-% IV SOLN: 1000.0000 mg | Freq: Once | INTRAVENOUS | Status: DC

## 2019-07-02 MED ORDER — SODIUM CHLORIDE 0.9 % IV SOLN
2.0000 g | Freq: Three times a day (TID) | INTRAVENOUS | Status: DC
  Administered 2019-07-02 – 2019-07-03 (×2): 2 g via INTRAVENOUS
  Filled 2019-07-02 (×5): qty 2

## 2019-07-02 MED ORDER — ENOXAPARIN SODIUM 40 MG/0.4ML ~~LOC~~ SOLN: 40.0000 mg | SUBCUTANEOUS | Status: DC

## 2019-07-02 MED ORDER — VANCOMYCIN HCL 750 MG/150ML IV SOLN
750.0000 mg | Freq: Two times a day (BID) | INTRAVENOUS | Status: DC
  Filled 2019-07-02 (×2): qty 150

## 2019-07-02 MED ORDER — VANCOMYCIN HCL 500 MG IV SOLR
INTRAVENOUS | Status: AC
  Filled 2019-07-02: qty 500

## 2019-07-02 MED ORDER — METRONIDAZOLE IN NACL 5-0.79 MG/ML-% IV SOLN
500.0000 mg | Freq: Once | INTRAVENOUS | Status: AC
  Administered 2019-07-02: 500 mg via INTRAVENOUS
  Filled 2019-07-02: qty 100

## 2019-07-02 MED ORDER — IOHEXOL 300 MG/ML  SOLN
100.0000 mL | Freq: Once | INTRAMUSCULAR | Status: AC | PRN
  Administered 2019-07-02: 100 mL via INTRAVENOUS

## 2019-07-02 NOTE — Progress Notes (Signed)
Pharmacy Antibiotic Note  [name] is a 79 y.o. male admitted on 07/02/2019 with sepsis.  Pharmacy has been consulted for vancomycin and cefepime dosing.  Plan: Vancomycin 1500 mg IV x 1, then 750 mg IV every 12 hours Goal AUC 400-550. Expected AUC: 473 SCr used: 1.02 Cefepime 2 g IV every 8 hours Add MRSA PCR Monitor renal function, Cx/PCR and clinical progression to narrow Vancomycin levels at steady state  Height: 6' (182.9 cm) Weight: 88 kg (193 lb 14.4 oz) IBW/kg (Calculated) : 77.6  Temp (24hrs), Avg:102.1 F (38.9 C), Min:101.2 F (38.4 C), Max:103 F (39.4 C)  Recent Labs  Lab 07/02/19 1921  WBC 5.6  CREATININE 1.02  LATICACIDVEN 0.9    Estimated Creatinine Clearance: 64.5 mL/min (by C-G formula based on SCr of 1.02 mg/dL).    Allergies  Allergen Reactions  . 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 Duratuss G [Guaifenesin]   . Hydrocodone   . Sulfa Drugs Cross Reactors     Swells tongue  . Tetracyclines & Related Rash    Daylene Posey, PharmD Clinical Pharmacist ED Pharmacist Phone # [phone] 07/02/2019 9:04 PM

## 2019-07-02 NOTE — ED Provider Notes (Addendum)
[facility] EMERGENCY DEPARTMENT Provider Note   CSN: [id number] Arrival date & time: 07/02/19  1824     History Chief Complaint  Patient presents with  . Abdominal Pain    [name] is a 79 y.o. male.  He is complaining of some left-sided abdominal pain it has been going on for 4 days.  Associated with some nausea.  No vomiting no diarrhea or constipation.  Urine becoming darker but no dysuria.  He went to urgent care where they did some blood work and sent him home with some Phenergan.  He took that medication and it made him feel dizzy and clumsy so he stopped it.  History of A. fib on anticoagulation.  Anticipated for cardioversion coming up later this month.  History of diverticulitis but says this does not feel like that.  The history is provided by the patient.  Abdominal Pain Pain location:  LUQ and LLQ Pain quality: aching   Pain radiates to:  Does not radiate Pain severity:  Moderate Onset quality:  Gradual Duration:  4 days Timing:  Intermittent Progression:  Worsening Chronicity:  New Context: not recent illness, not recent travel, not sick contacts and not trauma   Relieved by:  Nothing Worsened by:  Nothing Ineffective treatments:  None tried Associated symptoms: fever and nausea   Associated symptoms: no chest pain, no chills, no cough, no diarrhea, no dysuria, no hematemesis, no hematochezia, no hematuria, no shortness of breath, no sore throat and no vomiting        Past Medical History:  Diagnosis Date  . Atrial fibrillation ([place])    history of with cardioversion and no longer A Fib per Pt.  . Coronary artery disease   . Diverticula, colon   . Kidney stone     Patient Active Problem List   Diagnosis Date Noted  . 2-part displaced fracture of surgical neck of right humerus 06/27/2017  . 2-part displaced fracture of surgical neck of right humerus, initial encounter for closed fracture 06/26/2017  . Humerus fracture 06/26/2017    Past  Surgical History:  Procedure Laterality Date  . BICEPS TENDON REPAIR    . CARDIAC SURGERY     valve repair in Feb. 2015  . HEMORROIDECTOMY    . HERNIA REPAIR    . ORIF SHOULDER FRACTURE Right 06/27/2017   Procedure: OPEN REDUCTION INTERNAL FIXATION (ORIF) SHOULDER FRACTURE;  Surgeon: Netta Cedars, MD;  Location: [facility];  Service: Orthopedics;  Laterality: Right;       No family history on file.  Social History   Tobacco Use  . Smoking status: Never Smoker  . Smokeless tobacco: Never Used  Substance Use Topics  . Alcohol use: No  . Drug use: No    Home Medications Prior to Admission medications   Medication Sig Start Date End Date Taking? Authorizing Provider  acetaminophen (TYLENOL) 500 MG tablet Take 1,000 mg by mouth as needed for mild pain.    [provider]  alum hydroxide-mag trisilicate (GAVISCON) 74-25 MG CHEW chewable tablet Chew 2 tablets by mouth as needed for indigestion or heartburn.    [provider]  amiodarone (PACERONE) 200 MG tablet Take 100 mg by mouth daily.    [provider]  aspirin EC 81 MG tablet Take 81 mg by mouth daily.      [provider]  diltiazem (CARDIZEM CD) 180 MG 24 hr capsule Take 180 mg by mouth daily.    [provider]  fluticasone Asencion Islam)  50 MCG/ACT nasal spray Place 1 spray into the nose at bedtime.  05/19/17   [provider]  glucosamine-chondroitin 500-400 MG tablet Take 1 tablet by mouth daily.      [provider]  ibuprofen (ADVIL,MOTRIN) 200 MG tablet Take 400 mg by mouth as needed for pain.    [provider]  methocarbamol (ROBAXIN) 500 MG tablet Take 1 tablet (500 mg total) by mouth every 8 (eight) hours as needed. 06/27/17   Beverely Low, MD  Multiple Vitamin (MULTIVITAMIN) tablet Take 1 tablet by mouth daily.      [provider]  nortriptyline (PAMELOR) 50 MG capsule Take 50 mg by mouth at bedtime.    [provider]  pantoprazole  (PROTONIX) 40 MG tablet Take 40 mg by mouth 2 (two) times daily. 05/05/17   [provider]  polycarbophil (FIBERCON) 625 MG tablet Take 625 mg by mouth daily.      [provider]    Allergies    Amiodarone, Duratuss g [guaifenesin], Hydrocodone, Sulfa drugs cross reactors, and Tetracyclines & related  Review of Systems   Review of Systems  Constitutional: Positive for fever. Negative for chills.  HENT: Negative for sore throat.   Eyes: Negative for pain.  Respiratory: Negative for cough and shortness of breath.   Cardiovascular: Negative for chest pain.  Gastrointestinal: Positive for abdominal pain and nausea. Negative for diarrhea, hematemesis, hematochezia and vomiting.  Genitourinary: Negative for dysuria and hematuria.  Musculoskeletal: Negative for neck pain.  Skin: Negative for rash.  Neurological: Positive for dizziness.    Physical Exam Updated Vital Signs BP 114/63 (BP Location: Right Arm)   Pulse (!) 103   Temp (!) 101.2 F (38.4 C) (Oral)   Resp (!) 24   Ht 6' (1.829 m)   Wt 88 kg   SpO2 94%   BMI 26.30 kg/m   Physical Exam Vitals and nursing note reviewed.  Constitutional:      Appearance: He is well-developed.  HENT:     Head: Normocephalic and atraumatic.  Eyes:     Conjunctiva/sclera: Conjunctivae normal.  Cardiovascular:     Rate and Rhythm: Tachycardia present. Rhythm irregular.     Heart sounds: No murmur.  Pulmonary:     Effort: Pulmonary effort is normal. No respiratory distress.     Breath sounds: Normal breath sounds.  Abdominal:     Palpations: Abdomen is soft.     Tenderness: There is abdominal tenderness in the left lower quadrant. There is no guarding or rebound.  Musculoskeletal:        General: No tenderness, deformity or signs of injury. Normal range of motion.     Cervical back: Neck supple.  Skin:    General: Skin is warm and dry.     Capillary Refill: Capillary refill takes less than 2 seconds.  Neurological:       General: No focal deficit present.     Mental Status: He is alert.     ED Results / Procedures / Treatments   Labs (all labs ordered are listed, but only abnormal results are displayed) Labs Reviewed  COMPREHENSIVE METABOLIC PANEL - Abnormal; Notable for the following components:      Result Value   Glucose, Bld 129 (*)    Calcium 8.6 (*)    All other components within normal limits  LIPASE, BLOOD - Abnormal; Notable for the following components:   Lipase 122 (*)    All other components within normal limits  CBC  WITH DIFFERENTIAL/PLATELET - Abnormal; Notable for the following components:   Lymphs Abs 0.6 (*)    All other components within normal limits  URINALYSIS, ROUTINE W REFLEX MICROSCOPIC - Abnormal; Notable for the following components:   Ketones, ur 15 (*)    Protein, ur 30 (*)    All other components within normal limits  PROTIME-INR - Abnormal; Notable for the following components:   Prothrombin Time 19.3 (*)    INR 1.6 (*)    All other components within normal limits  URINALYSIS, MICROSCOPIC (REFLEX) - Abnormal; Notable for the following components:   Bacteria, UA FEW (*)    All other components within normal limits  RESPIRATORY PANEL BY RT PCR (FLU A&B, COVID)  CULTURE, BLOOD (ROUTINE X 2)  CULTURE, BLOOD (ROUTINE X 2)  URINE CULTURE  MRSA PCR SCREENING  LACTIC ACID, PLASMA    EKG EKG Interpretation  Date/Time:  Saturday July 02 2019 18:36:38 EDT Ventricular Rate:  94 PR Interval:    QRS Duration: 134 QT Interval:  324 QTC Calculation: 406 R Axis:   100 Text Interpretation: Atrial fibrillation Nonspecific intraventricular conduction delay Repol abnrm suggests ischemia, diffuse leads No old tracing to compare Confirmed by Meridee ScoreButler, Lilianah Buffin [id number]) on 07/02/2019 6:37:49 PM   Radiology CT Abdomen Pelvis W Contrast  Result Date: 07/02/2019 CLINICAL DATA:  Abdominal pain for several days EXAM: CT ABDOMEN AND PELVIS WITH CONTRAST TECHNIQUE: Multidetector  CT imaging of the abdomen and pelvis was performed using the standard protocol following bolus administration of intravenous contrast. CONTRAST:  100mL OMNIPAQUE IOHEXOL 300 MG/ML  SOLN COMPARISON:  Plain film from the previous day, CT from 06/09/2012 FINDINGS: Lower chest: Mild bibasilar atelectatic changes are noted. Hepatobiliary: Liver shows mild decreased attenuation consistent with fatty infiltration. Gallbladder is well distended with dependent gallstones. No wall thickening or pericholecystic fluid is noted. Pancreas: Unremarkable. No pancreatic ductal dilatation or surrounding inflammatory changes. Spleen: Normal in size without focal abnormality. Adrenals/Urinary Tract: Adrenal glands are within normal limits. Bilateral cystic changes are seen. Nonobstructing right renal calculi are noted. The largest of these is noted in the upper pole of the right kidney measuring 7 mm. No left-sided calculi seen. The ureters appear within normal limits. Bladder is decompressed. Stomach/Bowel: Scattered diverticular change of the colon is noted. No findings to suggest diverticulitis are seen. The appendix is within normal limits. Duodenal diverticulum is noted adjacent to the head of the pancreas. No obstructive changes are seen in the small bowel. Stomach is within normal limits. 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 Aortic calcifications are noted without aneurysmal dilatation. No lymphadenopathy is identified. Reproductive: Prostate is unremarkable. Other: No abdominal wall hernia or abnormality. No abdominopelvic ascites. Musculoskeletal: Mild degenerative changes of lumbar spine are noted. Chronic appearing compression deformities of L2 and L3 are noted. IMPRESSION: Nonobstructing right renal calculi as described. Diverticulosis without diverticulitis. Stable chronic changes as described above. Electronically Signed   By: Alcide CleverMark  Lukens  M.D.   On: 07/02/2019 20:46   DG Chest Port 1 View  Result Date: 07/02/2019 CLINICAL DATA:  Fever and abdominal pain. EXAM: PORTABLE CHEST 1 VIEW COMPARISON:  July 01, 2019 FINDINGS: There is atelectasis at the left lung base. There is no pneumothorax or large pleural effusion. The heart size is enlarged. Aortic calcifications are noted. The patient is status post prior median sternotomy. IMPRESSION: No active disease. Electronically Signed   By: Katherine Mantlehristopher  Green M.D.   On:  07/02/2019 20:50    Procedures Procedures (including critical care time)  Medications Ordered in ED Medications  metroNIDAZOLE (FLAGYL) IVPB 500 mg (has no administration in time range)  vancomycin (VANCOREADY) IVPB 1500 mg/300 mL (has no administration in time range)  ceFEPIme (MAXIPIME) 2 g in sodium chloride 0.9 % 100 mL IVPB (has no administration in time range)  vancomycin (VANCOREADY) IVPB 750 mg/150 mL (has no administration in time range)  sodium chloride 0.9 % bolus 500 mL (500 mLs Intravenous New Bag/Given 07/02/19 2004)  acetaminophen (TYLENOL) tablet 650 mg (650 mg Oral Given 07/02/19 1859)  iohexol (OMNIPAQUE) 300 MG/ML solution 100 mL (100 mLs Intravenous Contrast Given 07/02/19 2025)    ED Course  I have reviewed the triage vital signs and the nursing notes.  Pertinent labs & imaging results that were available during my care of the patient were reviewed by me and considered in my medical decision making (see chart for details).  Clinical Course as of Jul 01 2205  Sat Jul 02, 2019  2058 Unclear source.  CT abdomen and pelvis not showing any definitive abnormalities.  Chest x-ray negative normal white count normal chemistries normal LFTs other than a mild elevation of glucose and lipase.  Lactic acid normal Covid testing flu testing negative   [MB]  2204 Discussed with [facility] hospitalist Dr. Morene Antu who accepts the patient for admission to [facility] for observation who is fever and abdominal pain.   [MB]      Clinical Course User Index [MB] Terrilee Files, MD   MDM Rules/Calculators/A&P                     This patient complains of abdominal pain and fever; this involves an extensive number of treatment Options and is a complaint that carries with it a high risk of complications and Morbidity. The differential includes diverticulitis, colitis, cholelithiasis, cholecystitis, pyelonephritis.   I ordered, reviewed and interpreted labs, which included normal white blood cell count normal chemistries and LFTs other than mild elevations of glucose and mildly low calcium.  Normal urinalysis.  Normal Covid and flu testing.  Mild elevations of lipase at 122.  Unclear significance.NL lactate, Blood cultures pending. I ordered medication fluids antibiotics and Tylenol with improvement in his fever. I ordered imaging studies which included CT abdomen and pelvis and chest x-ray and I independently    visualized and interpreted imaging which showed no obvious explanation for fever or symptoms. Additional history obtained from wife Previous records obtained and reviewed  I consulted with [facility] hospitalist Dr.Fair and discussed lab and imaging findings.  After the interventions stated above, I reevaluated the patient and found his fever has improved.  CHA2DS2/VAS Stroke Risk Points      N/A >= 2 Points: High Risk  1 - 1.99 Points: Medium Risk  0 Points: Low Risk    A final score could not be computed because of missing components.: Last  Change: N/A     This score determines the patient's risk of having a stroke if the  patient has atrial fibrillation.      This score is not applicable to this patient. Components are not  calculated.     Final Clinical Impression(s) / ED Diagnoses Final diagnoses:  Left lower quadrant abdominal pain  Fever in adult  Paroxysmal atrial fibrillation [facility])    Rx / DC Orders ED Discharge Orders    None       Terrilee Files, MD 07/02/19 [phone]  Terrilee Files, MD 07/02/19 [phone]

## 2019-07-02 NOTE — ED Triage Notes (Signed)
Pt c/o abdominal pain X4 days, was seen at [facility] last night and had labs, was told he would be called if abnormal, has not been called back yet. Was given promethazine for nausea, reports that medication made his "feel off" so he stopped taking it. Pt has unsteady gait brought back to room in wheelchair. Reports known A Fib has planned upcoming cardioversion. Wife at bedside reports pt abdomen is distended.

## 2019-07-03 DIAGNOSIS — N200 Calculus of kidney: Secondary | ICD-10-CM

## 2019-07-03 DIAGNOSIS — R1084 Generalized abdominal pain: Secondary | ICD-10-CM | POA: Diagnosis not present

## 2019-07-03 DIAGNOSIS — E869 Volume depletion, unspecified: Secondary | ICD-10-CM | POA: Diagnosis present

## 2019-07-03 DIAGNOSIS — Z7982 Long term (current) use of aspirin: Secondary | ICD-10-CM | POA: Diagnosis not present

## 2019-07-03 DIAGNOSIS — I480 Paroxysmal atrial fibrillation: Secondary | ICD-10-CM | POA: Diagnosis present

## 2019-07-03 DIAGNOSIS — D696 Thrombocytopenia, unspecified: Secondary | ICD-10-CM | POA: Diagnosis present

## 2019-07-03 DIAGNOSIS — K5710 Diverticulosis of small intestine without perforation or abscess without bleeding: Secondary | ICD-10-CM | POA: Diagnosis present

## 2019-07-03 DIAGNOSIS — I4811 Longstanding persistent atrial fibrillation: Secondary | ICD-10-CM | POA: Diagnosis not present

## 2019-07-03 DIAGNOSIS — F509 Eating disorder, unspecified: Secondary | ICD-10-CM | POA: Diagnosis present

## 2019-07-03 DIAGNOSIS — K5792 Diverticulitis of intestine, part unspecified, without perforation or abscess without bleeding: Secondary | ICD-10-CM | POA: Diagnosis present

## 2019-07-03 DIAGNOSIS — Z885 Allergy status to narcotic agent status: Secondary | ICD-10-CM | POA: Diagnosis not present

## 2019-07-03 DIAGNOSIS — Z7901 Long term (current) use of anticoagulants: Secondary | ICD-10-CM | POA: Diagnosis not present

## 2019-07-03 DIAGNOSIS — I4891 Unspecified atrial fibrillation: Secondary | ICD-10-CM | POA: Diagnosis present

## 2019-07-03 DIAGNOSIS — Z20822 Contact with and (suspected) exposure to covid-19: Secondary | ICD-10-CM | POA: Diagnosis present

## 2019-07-03 DIAGNOSIS — Z882 Allergy status to sulfonamides status: Secondary | ICD-10-CM | POA: Diagnosis not present

## 2019-07-03 DIAGNOSIS — Z888 Allergy status to other drugs, medicaments and biological substances status: Secondary | ICD-10-CM | POA: Diagnosis not present

## 2019-07-03 DIAGNOSIS — I2510 Atherosclerotic heart disease of native coronary artery without angina pectoris: Secondary | ICD-10-CM | POA: Diagnosis present

## 2019-07-03 DIAGNOSIS — K760 Fatty (change of) liver, not elsewhere classified: Secondary | ICD-10-CM | POA: Diagnosis present

## 2019-07-03 DIAGNOSIS — Z79899 Other long term (current) drug therapy: Secondary | ICD-10-CM | POA: Diagnosis not present

## 2019-07-03 DIAGNOSIS — R1032 Left lower quadrant pain: Secondary | ICD-10-CM | POA: Diagnosis present

## 2019-07-03 LAB — COMPREHENSIVE METABOLIC PANEL
ALT: 36 U/L (ref 0–44)
AST: 42 U/L — ABNORMAL HIGH (ref 15–41)
Albumin: 3.3 g/dL — ABNORMAL LOW (ref 3.5–5.0)
Alkaline Phosphatase: 71 U/L (ref 38–126)
Anion gap: 7 (ref 5–15)
BUN: 13 mg/dL (ref 8–23)
CO2: 23 mmol/L (ref 22–32)
Calcium: 8.3 mg/dL — ABNORMAL LOW (ref 8.9–10.3)
Chloride: 107 mmol/L (ref 98–111)
Creatinine, Ser: 1.02 mg/dL (ref 0.61–1.24)
GFR calc Af Amer: >60 mL/min (ref >60)
GFR calc non Af Amer: >60 mL/min (ref >60)
Glucose, Bld: 126 mg/dL — ABNORMAL HIGH (ref 70–99)
Potassium: 3.5 mmol/L (ref 3.5–5.1)
Sodium: 137 mmol/L (ref 135–145)
Total Bilirubin: 1.3 mg/dL — ABNORMAL HIGH (ref 0.3–1.2)
Total Protein: 6.2 g/dL — ABNORMAL LOW (ref 6.5–8.1)

## 2019-07-03 LAB — CBC
HCT: 39.9 % (ref 39.0–52.0)
Hemoglobin: 12.7 g/dL — ABNORMAL LOW (ref 13.0–17.0)
MCH: 29.5 pg (ref 26.0–34.0)
MCHC: 31.8 g/dL (ref 30.0–36.0)
MCV: 92.8 fL (ref 80.0–100.0)
Platelets: 129 10*3/uL — ABNORMAL LOW (ref 150–400)
RBC: 4.3 MIL/uL (ref 4.22–5.81)
RDW: 14.8 % (ref 11.5–15.5)
WBC: 6.9 10*3/uL (ref 4.0–10.5)
nRBC: 0 % (ref 0.0–0.2)

## 2019-07-03 LAB — MRSA PCR SCREENING: MRSA by PCR: NEGATIVE

## 2019-07-03 MED ORDER — ACETAMINOPHEN 650 MG RE SUPP: 650.0000 mg | Freq: Four times a day (QID) | RECTAL | Status: DC | PRN

## 2019-07-03 MED ORDER — DILTIAZEM HCL-DEXTROSE 125-5 MG/125ML-% IV SOLN (PREMIX)
5.0000 mg/h | INTRAVENOUS | Status: DC
  Administered 2019-07-03: 5 mg/h via INTRAVENOUS
  Administered 2019-07-03 – 2019-07-04 (×2): 12.5 mg/h via INTRAVENOUS
  Filled 2019-07-03 (×5): qty 125

## 2019-07-03 MED ORDER — DEXTROSE-NACL 5-0.9 % IV SOLN: INTRAVENOUS | Status: DC

## 2019-07-03 MED ORDER — DILTIAZEM LOAD VIA INFUSION
10.0000 mg | Freq: Once | INTRAVENOUS | Status: AC
  Administered 2019-07-03: 10 mg via INTRAVENOUS
  Filled 2019-07-03: qty 10

## 2019-07-03 MED ORDER — ONDANSETRON HCL 4 MG/2ML IJ SOLN: 4.0000 mg | Freq: Four times a day (QID) | INTRAMUSCULAR | Status: DC | PRN

## 2019-07-03 MED ORDER — DILTIAZEM HCL 25 MG/5ML IV SOLN
10.0000 mg | Freq: Once | INTRAVENOUS | Status: AC
  Administered 2019-07-03: 10 mg via INTRAVENOUS
  Filled 2019-07-03: qty 5

## 2019-07-03 MED ORDER — APIXABAN 5 MG PO TABS
5.0000 mg | ORAL_TABLET | Freq: Two times a day (BID) | ORAL | Status: DC
  Administered 2019-07-03 – 2019-07-04 (×4): 5 mg via ORAL
  Filled 2019-07-03 (×4): qty 1

## 2019-07-03 MED ORDER — METRONIDAZOLE IN NACL 5-0.79 MG/ML-% IV SOLN
500.0000 mg | Freq: Three times a day (TID) | INTRAVENOUS | Status: DC
  Administered 2019-07-03 – 2019-07-04 (×3): 500 mg via INTRAVENOUS
  Filled 2019-07-03 (×3): qty 100

## 2019-07-03 MED ORDER — DILTIAZEM HCL ER COATED BEADS 240 MG PO CP24
240.0000 mg | ORAL_CAPSULE | Freq: Every day | ORAL | Status: DC
  Administered 2019-07-04: 240 mg via ORAL
  Filled 2019-07-03: qty 1

## 2019-07-03 MED ORDER — MORPHINE SULFATE (PF) 2 MG/ML IV SOLN: 1.0000 mg | INTRAVENOUS | Status: DC | PRN

## 2019-07-03 MED ORDER — METOPROLOL TARTRATE 25 MG PO TABS
12.5000 mg | ORAL_TABLET | Freq: Three times a day (TID) | ORAL | Status: DC
  Administered 2019-07-03 – 2019-07-04 (×4): 12.5 mg via ORAL
  Filled 2019-07-03 (×4): qty 1

## 2019-07-03 MED ORDER — SODIUM CHLORIDE 0.9 % IV SOLN
2.0000 g | INTRAVENOUS | Status: DC
  Administered 2019-07-03: 2 g via INTRAVENOUS
  Filled 2019-07-03: qty 20
  Filled 2019-07-03: qty 2

## 2019-07-03 MED ORDER — ONDANSETRON HCL 4 MG PO TABS: 4.0000 mg | ORAL_TABLET | Freq: Four times a day (QID) | ORAL | Status: DC | PRN

## 2019-07-03 MED ORDER — ACETAMINOPHEN 325 MG PO TABS
650.0000 mg | ORAL_TABLET | Freq: Four times a day (QID) | ORAL | Status: DC | PRN
  Administered 2019-07-03 (×2): 650 mg via ORAL
  Filled 2019-07-03 (×2): qty 2

## 2019-07-03 NOTE — Progress Notes (Signed)
PROGRESS NOTE    [name]  RAQ:[id number] DOB: [birth year] DOA: 07/02/2019 PCP: Forrest Moron, MD  Outpatient Specialists:   Brief Narrative:  Patient is a 79 year old Caucasian male with past medical history significant for atrial fibrillation on chronic anticoagulation with Eliquis, coronary artery disease, diverticulosis, history of kidney stones.  Patient was admitted with abdominal pain and fever.  T-max of 103 F noted.  No leukocytosis.  Abdominal pain has resolved.  Lactic acid is within normal range.  CT scan of the abdomen and pelvis findings could not explain the cause of abdominal pain off the left.  Patient has been pancultured.  Patient is on broad-spectrum antibiotic's.  Atrial fibrillation with RVR was noted on presentation.  Further management depend on hospital course.  Assessment & Plan:   Principal Problem:   Abdominal pain Active Problems:   Right nephrolithiasis   A-fib (HCC)  Abdominal pain and fever:  -Etiology is uncertain.   -Continue to follow pending work-up.   -Cultures are pending.   -Continue IV antibiotics.   -Thrombocytopenia is noted, will check acute hepatitis panel.   -Lipase is elevated, however, CT scan did not confirm pancreatitis.   -CT abdomen and pelvis reveals nephrolithiasis.   -Patient has had colicky pain in the past, and has passed stones in the past.  Patient does not feel that current presentation is likely related to nephrolithiasis.   -Urinalysis revealed few bacteria, but negative nitrite and leukocytes.   -No other GI symptoms reported.   -Further management depend on hospital course.    Atrial fibrillation with rapid ventricular response:  -Continue with anticoagulation -Continue rate control (currently on IV Cardizem).  Will add beta-blocker as the RVR is likely adrenergic driven.    Volume depletion: -Hydrate patient.  Right nephrolithiasis:  -Nonobstructive.  -Aggressive hydration.  Thrombocytopenia: -Cause  unclear. -Acute hepatitis panel. -Presentation not consistent with rickettsial infection -Continue to monitor.  DVT prophylaxis: Eliquis. Code Status: Full code. Family Communication:  Disposition Plan: Home eventually   Consultants:   None  Procedures:   None  Antimicrobials:   Initially on IV vancomycin, Flagyl and cefepime.  Currently on Rocephin and Flagyl.   Subjective: No fever Vague abdominal discomfort  Objective: Vitals:   07/03/19 1050 07/03/19 1408 07/03/19 1653 07/03/19 1709  BP: 103/66 125/74  (!) 108/59  Pulse: (!) 115 88  (!) 105  Resp: (!) 22 19    Temp: 99.1 F (37.3 C) 98.3 F (36.8 C) 99.3 F (37.4 C)   TempSrc: Oral Oral Oral   SpO2:  94%    Weight:      Height:        Intake/Output Summary (Last 24 hours) at 07/03/2019 1716 Last data filed at 07/03/2019 1611 Gross per 24 hour  Intake 2704.12 ml  Output 505 ml  Net 2199.12 ml   Filed Weights   07/02/19 1838  Weight: 88 kg    Examination:  General exam: Appears calm and comfortable  Respiratory system: Clear to auscultation. Respiratory effort normal. Cardiovascular system: S1 & S2 heard. Gastrointestinal system: Abdomen is nondistended, soft and nontender. No organomegaly or masses felt. Normal bowel sounds heard. Central nervous system: Alert and oriented.  Patient moves all extremities. Extremities: No leg edema.  Data Reviewed: I have personally reviewed following labs and imaging studies  CBC: Recent Labs  Lab 07/02/19 1921 07/03/19 0538  WBC 5.6 6.9  NEUTROABS 4.4  --   HGB 13.3 12.7*  HCT 40.2 39.9  MCV 89.5 92.8  PLT  157 129*   Basic Metabolic Panel: Recent Labs  Lab 07/02/19 1921 07/03/19 0538  NA 136 137  K 3.8 3.5  CL 103 107  CO2 24 23  GLUCOSE 129* 126*  BUN 18 13  CREATININE 1.02 1.02  CALCIUM 8.6* 8.3*   GFR: Estimated Creatinine Clearance: 64.5 mL/min (by C-G formula based on SCr of 1.02 mg/dL). Liver Function Tests: Recent Labs  Lab  07/02/19 1921 07/03/19 0538  AST 22 42*  ALT 18 36  ALKPHOS 63 71  BILITOT 1.1 1.3*  PROT 6.6 6.2*  ALBUMIN 3.7 3.3*   Recent Labs  Lab 07/02/19 1921  LIPASE 122*   No results for input(s): AMMONIA in the last 168 hours. Coagulation Profile: Recent Labs  Lab 07/02/19 1921  INR 1.6*   Cardiac Enzymes: No results for input(s): CKTOTAL, CKMB, CKMBINDEX, TROPONINI in the last 168 hours. BNP (last 3 results) No results for input(s): PROBNP in the last 8760 hours. HbA1C: No results for input(s): HGBA1C in the last 72 hours. CBG: No results for input(s): GLUCAP in the last 168 hours. Lipid Profile: No results for input(s): CHOL, HDL, LDLCALC, TRIG, CHOLHDL, LDLDIRECT in the last 72 hours. Thyroid Function Tests: No results for input(s): TSH, T4TOTAL, FREET4, T3FREE, THYROIDAB in the last 72 hours. Anemia Panel: No results for input(s): VITAMINB12, FOLATE, FERRITIN, TIBC, IRON, RETICCTPCT in the last 72 hours. Urine analysis:    Component Value Date/Time   COLORURINE YELLOW 07/02/2019 2135   APPEARANCEUR CLEAR 07/02/2019 2135   LABSPEC 1.015 07/02/2019 2135   PHURINE 5.5 07/02/2019 2135   GLUCOSEU NEGATIVE 07/02/2019 2135   HGBUR NEGATIVE 07/02/2019 2135   BILIRUBINUR NEGATIVE 07/02/2019 2135   KETONESUR 15 (A) 07/02/2019 2135   PROTEINUR 30 (A) 07/02/2019 2135   NITRITE NEGATIVE 07/02/2019 2135   LEUKOCYTESUR NEGATIVE 07/02/2019 2135   Sepsis Labs: @LABRCNTIP (procalcitonin:4,lacticidven:4)  ) Recent Results (from the past 240 hour(s))  Culture, blood (routine x 2)     Status: None (Preliminary result)   Collection Time: 07/02/19  7:22 PM   Specimen: BLOOD  Result Value Ref Range Status   Specimen Description   Final    BLOOD BLOOD RIGHT HAND Performed at [facility], [address] [place], [place]    Special Requests   Final    BOTTLES DRAWN AEROBIC AND ANAEROBIC Blood Culture adequate volume Performed at [facility], [address] [place], [place]    Culture   Final    NO GROWTH < 12 HOURS Performed at [facility], [address] [place], [place]    Report Status PENDING  Incomplete  Respiratory Panel by RT PCR (Flu A&B, Covid) - Nasopharyngeal Swab     Status: None   Collection Time: 07/02/19  7:26 PM   Specimen: Nasopharyngeal Swab  Result Value Ref Range Status   SARS Coronavirus 2 by RT PCR NEGATIVE NEGATIVE Final    Comment: (NOTE) SARS-CoV-2 target nucleic acids are NOT DETECTED. The SARS-CoV-2 RNA is generally detectable in upper respiratoy specimens during the acute phase of infection. The lowest concentration of SARS-CoV-2 viral copies this assay can detect is 131 copies/mL. A negative result does not preclude SARS-Cov-2 infection and should not be used as the sole basis for treatment or other patient management decisions. A negative result may occur with  improper specimen collection/handling, submission of specimen other than nasopharyngeal swab, presence of viral mutation(s) within the areas targeted by this  assay, and inadequate number of viral copies (<131 copies/mL). A negative result must be combined with clinical observations, patient history, and epidemiological information. The expected result is Negative. Fact Sheet for Patients:  https://www.moore.com/ Fact Sheet for Healthcare Providers:  https://www.young.biz/ This test is not yet ap proved or cleared by the [place] FDA and  has been authorized for detection and/or diagnosis of SARS-CoV-2 by FDA under an Emergency Use Authorization (EUA). This EUA will remain  in effect (meaning this test can be used) for the duration of the COVID-19 declaration under Section 564(b)(1) of the Act, 21 U.S.C. section 360bbb-3(b)(1), unless the authorization is terminated or revoked sooner.    Influenza A by PCR NEGATIVE NEGATIVE Final   Influenza B by PCR NEGATIVE  NEGATIVE Final    Comment: (NOTE) The Xpert Xpress SARS-CoV-2/FLU/RSV assay is intended as an aid in  the diagnosis of influenza from Nasopharyngeal swab specimens and  should not be used as a sole basis for treatment. Nasal washings and  aspirates are unacceptable for Xpert Xpress SARS-CoV-2/FLU/RSV  testing. Fact Sheet for Patients: https://www.moore.com/ Fact Sheet for Healthcare Providers: https://www.young.biz/ This test is not yet approved or cleared by the [place] FDA and  has been authorized for detection and/or diagnosis of SARS-CoV-2 by  FDA under an Emergency Use Authorization (EUA). This EUA will remain  in effect (meaning this test can be used) for the duration of the  Covid-19 declaration under Section 564(b)(1) of the Act, 21  U.S.C. section 360bbb-3(b)(1), unless the authorization is  terminated or revoked. Performed at [facility], [address] [place], [place] [address]   Culture, blood (routine x 2)     Status: None (Preliminary result)   Collection Time: 07/02/19  7:43 PM   Specimen: BLOOD  Result Value Ref Range Status   Specimen Description   Final    BLOOD LEFT ANTECUBITAL Performed at [facility], [address] [place], [place] [address]    Special Requests   Final    BOTTLES DRAWN AEROBIC AND ANAEROBIC Blood Culture adequate volume Performed at [facility], [address] [place], [place] [address]    Culture   Final    NO GROWTH < 12 HOURS Performed at [facility], [address] [place], [place] [address]    Report Status PENDING  Incomplete  MRSA PCR Screening     Status: None   Collection Time: 07/03/19  1:24 AM   Specimen: Nasal Mucosa; Nasopharyngeal  Result Value Ref Range Status   MRSA by PCR NEGATIVE NEGATIVE Final    Comment:        The GeneXpert MRSA Assay (FDA approved for NASAL specimens only), is one component of a comprehensive MRSA  colonization surveillance program. It is not intended to diagnose MRSA infection nor to guide or monitor treatment for MRSA infections. Performed at [facility], [address], [place], [place] [address]          Radiology Studies: CT Abdomen Pelvis W Contrast  Result Date: 07/02/2019 CLINICAL DATA:  Abdominal pain for several days EXAM: CT ABDOMEN AND PELVIS WITH CONTRAST TECHNIQUE: Multidetector CT imaging of the abdomen and pelvis was performed using the standard protocol following bolus administration of intravenous contrast. CONTRAST:  OMNIPAQUE IOHEXOL 300 MG/ML  SOLN COMPARISON:  Plain film from the previous day, CT from 06/09/2012 FINDINGS: Lower chest: Mild bibasilar atelectatic changes are noted. Hepatobiliary: Liver shows mild decreased  attenuation consistent with fatty infiltration. Gallbladder is well distended with dependent gallstones. No wall thickening or pericholecystic fluid is noted. Pancreas: Unremarkable. No pancreatic ductal dilatation or surrounding inflammatory changes. Spleen: Normal in size without focal abnormality. Adrenals/Urinary Tract: Adrenal glands are within normal limits. Bilateral cystic changes are seen. Nonobstructing right renal calculi are noted. The largest of these is noted in the upper pole of the right kidney measuring 7 mm. No left-sided calculi seen. The ureters appear within normal limits. Bladder is decompressed. Stomach/Bowel: Scattered diverticular change of the colon is noted. No findings to suggest diverticulitis are seen. The appendix is within normal limits. Duodenal diverticulum is noted adjacent to the head of the pancreas. No obstructive changes are seen in the small bowel. Stomach is within normal limits. 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 Aortic calcifications are noted without aneurysmal dilatation. No lymphadenopathy is  identified. Reproductive: Prostate is unremarkable. Other: No abdominal wall hernia or abnormality. No abdominopelvic ascites. Musculoskeletal: Mild degenerative changes of lumbar spine are noted. Chronic appearing compression deformities of L2 and L3 are noted. IMPRESSION: Nonobstructing right renal calculi as described. Diverticulosis without diverticulitis. Stable chronic changes as described above. Electronically Signed   By: Inez Catalina M.D.   On: 07/02/2019 20:46   DG Chest Port 1 View  Result Date: 07/02/2019 CLINICAL DATA:  Fever and abdominal pain. EXAM: PORTABLE CHEST 1 VIEW COMPARISON:  July 01, 2019 FINDINGS: There is atelectasis at the left lung base. There is no pneumothorax or large pleural effusion. The heart size is enlarged. Aortic calcifications are noted. The patient is status post prior median sternotomy. IMPRESSION: No active disease. Electronically Signed   By: Constance Holster M.D.   On: 07/02/2019 20:50   Scheduled Meds: . apixaban  5 mg Oral BID  . diltiazem  240 mg Oral Daily  . metoprolol tartrate  12.5 mg Oral TID   Continuous Infusions: . cefTRIAXone (ROCEPHIN)  IV 2 g (07/03/19 1404)  . dextrose 5 % and 0.9% NaCl 100 mL/hr at 07/03/19 1610  . diltiazem (CARDIZEM) infusion 12.5 mg/hr (07/03/19 1611)  . metronidazole 500 mg (07/03/19 1515)     LOS: 0 days    Time spent: 35 minutes    Dana Allan, MD  [facility] Pager #: [phone] 7PM-7AM contact night coverage as above

## 2019-07-03 NOTE — Progress Notes (Signed)
Pt HR currently 115. MD notified of orders for both po and iv cardizem. Will wait on response on plan for cardizem gtt. Will continue to monitor pt.

## 2019-07-03 NOTE — Progress Notes (Signed)
Pt HR currently 115-125 on telemetry. MD was notified that cardizem gtt has been titrated 2 times already. If the drip needs to be titrated anymore the pt will need to be transferred to SDU. Currently awaiting on response from MD. Will continue to closely monitor pt.

## 2019-07-03 NOTE — H&P (Signed)
History and Physical   Manraj Yeo ZOX:[id number] DOB: 04/11/1940 DOA: 07/02/2019  Referring MD/NP/PA: Dr. Charm Barges  PCP: Forrest Moron, MD   Outpatient Specialists: None  Patient coming from: [facility]  Chief Complaint: Nominal pain  HPI: [name] is a 79 y.o. male with medical history significant of atrial fibrillation on chronic anticoagulation with Eliquis, coronary artery disease, diverticulosis, history of kidney stones who is scheduled to have cardioversion later this month but went to med [facility] tonight secondary to abdominal pain 4 days ago was progressively getting worse. It did not feel like his typical diverticulitis or nephrolithiasis. Patient also had low-grade temperature. Denied any nausea vomiting or diarrhea. He denied any constipation. He was initially seen in urgent care center where his blood work was normal and was sent home. Patient returned to urgent care today because pain has gotten worse mainly on the left side. Was repeated including CT abdomen pelvis that only showed right-sided nephrolithiasis. Patient was treated symptomatically with fluids antibiotics and Tylenol. Fever is getting better and pain is now much better. He is being admitted for observation and treatment of possible early sepsis from abdominal source...  ED Course: Temperature 103 blood pressure 102/64 pulse 104 respiratory rate of 28 oxygen sat 89% on room air. Sodium 136 potassium 3.8 chloride 103 CO2 24 glucose 129. Gap of 9. Lipase 122. COVID-19 is negative. PT 19.3 INR 1.6 glucose 129. Urinalysis essentially negative. And pelvis showed nonobstructive right renal stones with diverticulosis. Patient being admitted for further work-up. Also for pain control  Review of Systems: As per HPI otherwise 10 point review of systems negative.    Past Medical History:  Diagnosis Date  . Atrial fibrillation (HCC)    history of with cardioversion and no longer A Fib per Pt.  .  Coronary artery disease   . Diverticula, colon   . Kidney stone     Past Surgical History:  Procedure Laterality Date  . BICEPS TENDON REPAIR    . CARDIAC SURGERY     valve repair in Feb. 2015  . HEMORROIDECTOMY    . HERNIA REPAIR    . ORIF SHOULDER FRACTURE Right 06/27/2017   Procedure: OPEN REDUCTION INTERNAL FIXATION (ORIF) SHOULDER FRACTURE;  Surgeon: Beverely Low, MD;  Location: [facility] OR;  Service: Orthopedics;  Laterality: Right;     reports that he has never smoked. He has never used smokeless tobacco. He reports that he does not drink alcohol or use drugs.  Allergies  Allergen Reactions  . 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 Duratuss G [Guaifenesin]   . Hydrocodone   . Sulfa Drugs Cross Reactors     Swells tongue  . Tetracyclines & Related Rash    No family history on file.   Prior to Admission medications   Medication Sig Start Date End Date Taking? Authorizing Provider  diltiazem (TIAZAC) 240 MG 24 hr capsule diltiazem CD 240 mg capsule,extended release 24 hr 03/10/19  Yes [provider]  ELIQUIS 5 MG TABS tablet Take 5 mg by mouth 2 (two) times daily. 07/02/19  Yes [provider]  Multiple Vitamin (MULTIVITAMIN) tablet Take 1 tablet by mouth daily.     Yes [provider]  pantoprazole (PROTONIX) 40 MG tablet Take 40 mg by mouth 2 (two) times daily. 05/05/17  Yes [provider]  polycarbophil (FIBERCON) 625 MG tablet Take 625 mg by mouth daily.     Yes  [provider]  promethazine (PHENERGAN) 25 MG tablet  07/01/19  Yes [provider]  propafenone (RYTHMOL) 150 MG tablet Take 150 mg by mouth 3 (three) times daily. 06/15/19  Yes [provider]  acetaminophen (TYLENOL) 500 MG tablet Take 1,000 mg by mouth as needed for mild pain.    [provider]  alum hydroxide-mag trisilicate (GAVISCON) 23-76 MG CHEW chewable tablet Chew 2 tablets by mouth as needed for indigestion or  heartburn.    [provider]  amiodarone (PACERONE) 200 MG tablet Take 100 mg by mouth daily.    [provider]  aspirin EC 81 MG tablet Take 81 mg by mouth daily.      [provider]  diltiazem (CARDIZEM CD) 180 MG 24 hr capsule Take 180 mg by mouth daily.    [provider]  fluticasone (FLONASE) 50 MCG/ACT nasal spray Place 1 spray into the nose at bedtime.  05/19/17   [provider]  glucosamine-chondroitin 500-400 MG tablet Take 1 tablet by mouth daily.      [provider]  ibuprofen (ADVIL,MOTRIN) 200 MG tablet Take 400 mg by mouth as needed for pain.    [provider]  methocarbamol (ROBAXIN) 500 MG tablet Take 1 tablet (500 mg total) by mouth every 8 (eight) hours as needed. 06/27/17   Netta Cedars, MD  nortriptyline (PAMELOR) 50 MG capsule Take 50 mg by mouth at bedtime.    [provider]    Physical Exam: Vitals:   07/02/19 2230 07/02/19 2300 07/02/19 2330 07/03/19 0053  BP: 111/72 114/72 102/64 118/80  Pulse: 99 (!) 52 97 90  Resp: (!) 23 (!) 24 19 20   Temp:    99.8 F (37.7 C)  TempSrc:    Oral  SpO2: 96% 94% 95% 98%  Weight:      Height:          Constitutional: Acutely ill looking, no acute distress Vitals:   07/02/19 2230 07/02/19 2300 07/02/19 2330 07/03/19 0053  BP: 111/72 114/72 102/64 118/80  Pulse: 99 (!) 52 97 90  Resp: (!) 23 (!) 24 19 20   Temp:    99.8 F (37.7 C)  TempSrc:    Oral  SpO2: 96% 94% 95% 98%  Weight:      Height:       Eyes: PERRL, lids and conjunctivae normal ENMT: Mucous membranes are dry. Posterior pharynx clear of any exudate or lesions.Normal dentition.  Neck: normal, supple, no masses, no thyromegaly Respiratory: clear to auscultation bilaterally, no wheezing, no crackles. Normal respiratory effort. No accessory muscle use.  Cardiovascular: Regular rate and rhythm, no murmurs / rubs / gallops. No extremity edema. 2+ pedal pulses. No carotid bruits.    Abdomen: Soft, mild left-sided tenderness, no masses palpated. No hepatosplenomegaly. Bowel sounds positive.  Musculoskeletal: no clubbing / cyanosis. No joint deformity upper and lower extremities. Good ROM, no contractures. Normal muscle tone.  Skin: no rashes, lesions, ulcers. No induration Neurologic: CN 2-12 grossly intact. Sensation intact, DTR normal. Strength 5/5 in all 4.  Psychiatric: Normal judgment and insight. Alert and oriented x 3. Normal mood.     Labs on Admission: I have personally reviewed following labs and imaging studies  CBC: Recent Labs  Lab 07/02/19 1921  WBC 5.6  NEUTROABS 4.4  HGB 13.3  HCT 40.2  MCV 89.5  PLT 283   Basic Metabolic Panel: Recent Labs  Lab 07/02/19 1921  NA 136  K 3.8  CL 103  CO2 24  GLUCOSE 129*  BUN 18  CREATININE 1.02  CALCIUM 8.6*   GFR: Estimated Creatinine Clearance: 64.5 mL/min (by C-G formula based on SCr of 1.02 mg/dL). Liver Function Tests: Recent Labs  Lab 07/02/19 1921  AST 22  ALT 18  ALKPHOS 63  BILITOT 1.1  PROT 6.6  ALBUMIN 3.7   Recent Labs  Lab 07/02/19 1921  LIPASE 122*   No results for input(s): AMMONIA in the last 168 hours. Coagulation Profile: Recent Labs  Lab 07/02/19 1921  INR 1.6*   Cardiac Enzymes: No results for input(s): CKTOTAL, CKMB, CKMBINDEX, TROPONINI in the last 168 hours. BNP (last 3 results) No results for input(s): PROBNP in the last 8760 hours. HbA1C: No results for input(s): HGBA1C in the last 72 hours. CBG: No results for input(s): GLUCAP in the last 168 hours. Lipid Profile: No results for input(s): CHOL, HDL, LDLCALC, TRIG, CHOLHDL, LDLDIRECT in the last 72 hours. Thyroid Function Tests: No results for input(s): TSH, T4TOTAL, FREET4, T3FREE, THYROIDAB in the last 72 hours. Anemia Panel: No results for input(s): VITAMINB12, FOLATE, FERRITIN, TIBC, IRON, RETICCTPCT in the last 72 hours. Urine analysis:    Component Value Date/Time   COLORURINE YELLOW  07/02/2019 2135   APPEARANCEUR CLEAR 07/02/2019 2135   LABSPEC 1.015 07/02/2019 2135   PHURINE 5.5 07/02/2019 2135   GLUCOSEU NEGATIVE 07/02/2019 2135   HGBUR NEGATIVE 07/02/2019 2135   BILIRUBINUR NEGATIVE 07/02/2019 2135   KETONESUR 15 (A) 07/02/2019 2135   PROTEINUR 30 (A) 07/02/2019 2135   NITRITE NEGATIVE 07/02/2019 2135   LEUKOCYTESUR NEGATIVE 07/02/2019 2135   Sepsis Labs: @LABRCNTIP (procalcitonin:4,lacticidven:4) ) Recent Results (from the past 240 hour(s))  Respiratory Panel by RT PCR (Flu A&B, Covid) - Nasopharyngeal Swab     Status: None   Collection Time: 07/02/19  7:26 PM   Specimen: Nasopharyngeal Swab  Result Value Ref Range Status   SARS Coronavirus 2 by RT PCR NEGATIVE NEGATIVE Final    Comment: (NOTE) SARS-CoV-2 target nucleic acids are NOT DETECTED. The SARS-CoV-2 RNA is generally detectable in upper respiratoy specimens during the acute phase of infection. The lowest concentration of SARS-CoV-2 viral copies this assay can detect is 131 copies/mL. A negative result does not preclude SARS-Cov-2 infection and should not be used as the sole basis for treatment or other patient management decisions. A negative result may occur with  improper specimen collection/handling, submission of specimen other than nasopharyngeal swab, presence of viral mutation(s) within the areas targeted by this assay, and inadequate number of viral copies (<131 copies/mL). A negative result must be combined with clinical observations, patient history, and epidemiological information. The expected result is Negative. Fact Sheet for Patients:  https://www.moore.com/https://www.fda.gov/media/142436/download Fact Sheet for Healthcare Providers:  https://www.young.biz/https://www.fda.gov/media/142435/download This test is not yet ap proved or cleared by the [place] FDA and  has been authorized for detection and/or diagnosis of SARS-CoV-2 by FDA under an Emergency Use Authorization (EUA). This EUA will remain  in effect  (meaning this test can be used) for the duration of the COVID-19 declaration under Section 564(b)(1) of the Act, 21 U.S.C. section 360bbb-3(b)(1), unless the authorization is terminated or revoked sooner.    Influenza A by PCR NEGATIVE NEGATIVE Final   Influenza B by PCR NEGATIVE NEGATIVE Final    Comment: (NOTE) The Xpert Xpress SARS-CoV-2/FLU/RSV assay is intended as an aid in  the diagnosis of influenza from Nasopharyngeal swab specimens and  should not be used as a sole basis for treatment. Nasal washings and  aspirates are unacceptable for Xpert Xpress SARS-CoV-2/FLU/RSV  testing. Fact Sheet for Patients: https://www.moore.com/ Fact Sheet for Healthcare Providers: https://www.young.biz/ This test is not yet approved or cleared by the [place] FDA and  has been authorized for detection and/or diagnosis of SARS-CoV-2 by  FDA under an Emergency Use Authorization (EUA). This EUA will remain  in effect (meaning this test can be used) for the duration of the  Covid-19 declaration under Section 564(b)(1) of the Act, 21  U.S.C. section 360bbb-3(b)(1), unless the authorization is  terminated or revoked. Performed at [facility], [address] [place], [place] [address]      Radiological Exams on Admission: CT Abdomen Pelvis W Contrast  Result Date: 07/02/2019 CLINICAL DATA:  Abdominal pain for several days EXAM: CT ABDOMEN AND PELVIS WITH CONTRAST TECHNIQUE: Multidetector CT imaging of the abdomen and pelvis was performed using the standard protocol following bolus administration of intravenous contrast. CONTRAST:  OMNIPAQUE IOHEXOL 300 MG/ML  SOLN COMPARISON:  Plain film from the previous day, CT from 06/09/2012 FINDINGS: Lower chest: Mild bibasilar atelectatic changes are noted. Hepatobiliary: Liver shows mild decreased attenuation consistent with fatty infiltration. Gallbladder is well distended with dependent gallstones. No  wall thickening or pericholecystic fluid is noted. Pancreas: Unremarkable. No pancreatic ductal dilatation or surrounding inflammatory changes. Spleen: Normal in size without focal abnormality. Adrenals/Urinary Tract: Adrenal glands are within normal limits. Bilateral cystic changes are seen. Nonobstructing right renal calculi are noted. The largest of these is noted in the upper pole of the right kidney measuring 7 mm. No left-sided calculi seen. The ureters appear within normal limits. Bladder is decompressed. Stomach/Bowel: Scattered diverticular change of the colon is noted. No findings to suggest diverticulitis are seen. The appendix is within normal limits. Duodenal diverticulum is noted adjacent to the head of the pancreas. No obstructive changes are seen in the small bowel. Stomach is within normal limits. 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 Aortic calcifications are noted without aneurysmal dilatation. No lymphadenopathy is identified. Reproductive: Prostate is unremarkable. Other: No abdominal wall hernia or abnormality. No abdominopelvic ascites. Musculoskeletal: Mild degenerative changes of lumbar spine are noted. Chronic appearing compression deformities of L2 and L3 are noted. IMPRESSION: Nonobstructing right renal calculi as described. Diverticulosis without diverticulitis. Stable chronic changes as described above. Electronically Signed   By: Alcide Clever M.D.   On: 07/02/2019 20:46   DG Chest Port 1 View  Result Date: 07/02/2019 CLINICAL DATA:  Fever and abdominal pain. EXAM: PORTABLE CHEST 1 VIEW COMPARISON:  July 01, 2019 FINDINGS: There is atelectasis at the left lung base. There is no pneumothorax or large pleural effusion. The heart size is enlarged. Aortic calcifications are noted. The patient is status post prior median sternotomy. IMPRESSION: No active disease. Electronically Signed   By: Katherine Mantle M.D.   On: 07/02/2019 20:50    Assessment/Plan Principal Problem:   Abdominal pain Active Problems:   Right nephrolithiasis   A-fib (HCC)     #1 abdominal pain and fever: Suspected cholelithiasis or nephrolithiasis but urinalysis is negative. Lipase is elevated so pancreatitis is possible but not seen on CT. It could be some acute viral illness. Patient will be admitted for observation and supportive care. Pain is better now. No nausea vomiting or diarrhea. Empirically on cefepime. Could be some early colitis that is not yet obvious. We will monitor his response.  #2 atrial fibrillation: Continue with anticoagulation  and rate control. Confirm patient's home medications are continued.  #3 right nephrolithiasis: Nonobstructive. I doubt this is playing a role in his pain. He could still have some colic may have passed other stones. At this point will monitor closely. Supportive care and pain management   DVT prophylaxis: Eliquis Code Status: Full code Family Communication: No family at bedside Disposition Plan: Home Consults called: None Admission status: Observation  Severity of Illness: The appropriate patient status for this patient is OBSERVATION. Observation status is judged to be reasonable and necessary in order to provide the required intensity of service to ensure the patient's safety. The patient's presenting symptoms, physical exam findings, and initial radiographic and laboratory data in the context of their medical condition is felt to place them at decreased risk for further clinical deterioration. Furthermore, it is anticipated that the patient will be medically stable for discharge from the hospital within 2 midnights of admission. The following factors support the patient status of observation.   " The patient's presenting symptoms include abdominal pain and fever. " The physical exam findings include no specific tenderness. " The initial radiographic and laboratory  data are CT findings of nephrolithiasis.     Lonia Blood MD [facility] Pager [phone]  If 7PM-7AM, please contact night-coverage [email or web address] Password [email or web address]  07/03/2019, 1:06 AM

## 2019-07-04 LAB — CBC WITH DIFFERENTIAL/PLATELET
Abs Immature Granulocytes: 0.02 10*3/uL (ref 0.00–0.07)
Basophils Absolute: 0 10*3/uL (ref 0.0–0.1)
Basophils Relative: 0 %
Eosinophils Absolute: 0.1 10*3/uL (ref 0.0–0.5)
Eosinophils Relative: 1 %
HCT: 39.4 % (ref 39.0–52.0)
Hemoglobin: 12.5 g/dL — ABNORMAL LOW (ref 13.0–17.0)
Immature Granulocytes: 0 %
Lymphocytes Relative: 13 %
Lymphs Abs: 1.1 10*3/uL (ref 0.7–4.0)
MCH: 29 pg (ref 26.0–34.0)
MCHC: 31.7 g/dL (ref 30.0–36.0)
MCV: 91.4 fL (ref 80.0–100.0)
Monocytes Absolute: 0.9 10*3/uL (ref 0.1–1.0)
Monocytes Relative: 12 %
Neutro Abs: 5.8 10*3/uL (ref 1.7–7.7)
Neutrophils Relative %: 74 %
Platelets: 141 10*3/uL — ABNORMAL LOW (ref 150–400)
RBC: 4.31 MIL/uL (ref 4.22–5.81)
RDW: 14.9 % (ref 11.5–15.5)
WBC: 7.9 10*3/uL (ref 4.0–10.5)
nRBC: 0 % (ref 0.0–0.2)

## 2019-07-04 LAB — RENAL FUNCTION PANEL
Albumin: 3.2 g/dL — ABNORMAL LOW (ref 3.5–5.0)
Anion gap: 7 (ref 5–15)
BUN: 13 mg/dL (ref 8–23)
CO2: 23 mmol/L (ref 22–32)
Calcium: 8.2 mg/dL — ABNORMAL LOW (ref 8.9–10.3)
Chloride: 107 mmol/L (ref 98–111)
Creatinine, Ser: 0.98 mg/dL (ref 0.61–1.24)
GFR calc Af Amer: >60 mL/min (ref >60)
GFR calc non Af Amer: >60 mL/min (ref >60)
Glucose, Bld: 118 mg/dL — ABNORMAL HIGH (ref 70–99)
Phosphorus: 2.5 mg/dL (ref 2.5–4.6)
Potassium: 3.8 mmol/L (ref 3.5–5.1)
Sodium: 137 mmol/L (ref 135–145)

## 2019-07-04 LAB — MAGNESIUM: Magnesium: 1.8 mg/dL (ref 1.7–2.4)

## 2019-07-04 LAB — URINE CULTURE: Culture: <10000 — AB

## 2019-07-04 LAB — HEPATITIS PANEL, ACUTE
HCV Ab: NONREACTIVE
Hep A IgM: NONREACTIVE
Hep B C IgM: NONREACTIVE
Hepatitis B Surface Ag: NONREACTIVE

## 2019-07-04 MED ORDER — SODIUM CHLORIDE 0.9 % IV SOLN
INTRAVENOUS | Status: DC | PRN
  Administered 2019-07-04: 250 mL via INTRAVENOUS

## 2019-07-04 MED ORDER — AMOXICILLIN-POT CLAVULANATE 875-125 MG PO TABS: 1.0000 | ORAL_TABLET | Freq: Two times a day (BID) | ORAL | 0 refills | Status: AC

## 2019-07-04 NOTE — Plan of Care (Signed)

## 2019-07-04 NOTE — Discharge Summary (Signed)
Physician Discharge Summary  Christophere Hillhouse LOV:[id number] DOB: 01-06-1941 DOA: 07/02/2019  PCP: Forrest Moron, MD  Admit date: 07/02/2019 Discharge date: 07/04/2019  Admitted From: Home Disposition:  Home  Discharge Condition:Stable CODE STATUS:FULL Diet recommendation: Heart Healthy   Brief/Interim Summary:  Patient is a 79 year old Caucasian male with past medical history significant for atrial fibrillation on chronic anticoagulation with Eliquis, coronary artery disease, diverticulosis, history of kidney stones.  Patient was admitted with abdominal pain and fever.  T-max of 103 F noted.  No leukocytosis.  Abdominal pain has resolved.  Lactic acid is within normal range.  CT scan of the abdomen and pelvis findings could not explain the cause of abdominal pain off the left.  Patient has been pancultured.  Patient is on broad-spectrum antibiotics.  Atrial fibrillation with RVR was noted on presentation which has resolved after starting Cardizem drip. Patient is hemodynamically stable and afebrile.  Blood cultures have been negative so far. He is hemodynamically stable for discharge to home today.  Following problems were addressed his hospitalization;  Abdominal pain and fever: -Etiology is uncertain.     -Cultures are pending,NGTD.   -Started on broad spectrum IV antibiotics .    Antibiotics changed to oral on discharge.  -Lipase is elevated, however, CT scan did not confirm pancreatitis.   -CT abdomen and pelvis reveals non obstructive 7 mm right  nephrolithiasis.   -Patient has had colicky pain in the past, and has passed stones in the past.  Patient does not feel that current presentation is likely related to nephrolithiasis.   -Urinalysis revealed few bacteria, but negative nitrite and leukocytes.   -No other GI symptoms reported.   -Afebrile today    Atrial fibrillation with rapid ventricular response:  -Patient.  Currently heart rate is much better controlled. -Continue home  meds   Thrombocytopenia: -Cause unclear. -Check CBC in a week. -CT abdomen showed fatty liver    Discharge Diagnoses:  Principal Problem:   Abdominal pain Active Problems:   Right nephrolithiasis   A-fib [facility] [place] [facility])    Discharge Instructions  Discharge Instructions    Diet - low sodium heart healthy   Complete by: As directed    Discharge instructions   Complete by: As directed    1)Please follow up with your PCP in a week. 2)Take prescribed medications as instructed.   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   alum hydroxide-mag trisilicate 80-20 MG Chew chewable tablet Commonly known as: GAVISCON Chew 2 tablets by mouth as needed for indigestion or heartburn.   amoxicillin-clavulanate 875-125 MG tablet Commonly known as: Augmentin Take 1 tablet by mouth 2 (two) times daily for 5 days.   diltiazem 240 MG 24 hr capsule Commonly known as: CARDIZEM CD Take 240 mg by mouth daily.   Eliquis 5 MG Tabs  tablet Generic drug: apixaban Take 5 mg by mouth 2 (two) times daily.   fluticasone 50 MCG/ACT nasal spray Commonly known as: FLONASE Place 1 spray into the nose at bedtime.   multivitamin tablet Take 1 tablet by mouth daily.   pantoprazole 40 MG tablet Commonly known as: PROTONIX Take 40 mg by mouth 2 (two) times daily.   propafenone 150 MG tablet Commonly known as: RYTHMOL Take 150 mg by mouth 3 (three) times daily.       Follow-up Information    Forrest Moron, MD. Schedule an appointment as soon as possible for a visit in 1 week(s).   Specialty: Internal Medicine Contact information: [address] [place] [address] [phone]          Allergies  Allergen Reactions  . 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 Duratuss G [Guaifenesin]   . Hydrocodone   . Sulfa Drugs Cross Reactors     Swells tongue  . Tetracyclines & Related Rash    Consultations:  None   Procedures/Studies: CT Abdomen Pelvis W Contrast  Result Date: 07/02/2019 CLINICAL DATA:  Abdominal pain for several days EXAM: CT ABDOMEN AND PELVIS WITH CONTRAST TECHNIQUE: Multidetector CT imaging of the abdomen and pelvis was performed using the standard protocol following bolus administration of intravenous contrast. CONTRAST:  OMNIPAQUE IOHEXOL 300 MG/ML  SOLN COMPARISON:  Plain film from the previous day, CT from 06/09/2012 FINDINGS: Lower chest: Mild bibasilar atelectatic changes are noted. Hepatobiliary: Liver shows mild decreased attenuation consistent with fatty infiltration. Gallbladder is well distended with dependent gallstones. No wall thickening or pericholecystic fluid is noted. Pancreas: Unremarkable. No pancreatic ductal dilatation or surrounding inflammatory changes. Spleen: Normal in size without focal abnormality. Adrenals/Urinary Tract: Adrenal glands are within normal limits. Bilateral cystic changes are seen. Nonobstructing right renal calculi are noted. The largest of these is noted in the upper pole of the right kidney measuring 7 mm. No left-sided calculi seen. The ureters appear within normal limits. Bladder is decompressed. Stomach/Bowel: Scattered diverticular change of the colon is noted. No findings to suggest diverticulitis are seen. The appendix is within normal limits. Duodenal diverticulum is noted adjacent to the head of the pancreas. No obstructive changes are seen in  the small bowel. Stomach is within normal limits. 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 Aortic calcifications are noted without aneurysmal dilatation. No lymphadenopathy is identified. Reproductive: Prostate is unremarkable. Other: No abdominal wall hernia or abnormality. No abdominopelvic ascites. Musculoskeletal: Mild degenerative changes of lumbar spine are noted. Chronic appearing compression deformities of L2 and L3 are noted. IMPRESSION: Nonobstructing right renal calculi as described. Diverticulosis without diverticulitis. Stable chronic changes as described above. Electronically Signed   By: Alcide Clever M.D.   On: 07/02/2019 20:46   DG Chest Port 1 View  Result Date: 07/02/2019 CLINICAL DATA:  Fever and abdominal pain. EXAM: PORTABLE CHEST 1 VIEW COMPARISON:  July 01, 2019 FINDINGS: There is atelectasis at the left lung base. There is no pneumothorax or large pleural effusion. The heart size is enlarged. Aortic calcifications are noted. The patient is status post prior median sternotomy. IMPRESSION: No active disease. Electronically Signed   By: Katherine Mantle M.D.   On: 07/02/2019 20:50       Subjective: Patient seen and examined at bedside this morning.  Hemodynamically stable for discharge today.  Discharge Exam: Vitals:  07/04/19 0510 07/04/19 0932  BP: 108/75 118/65  Pulse: 99 (!) 105  Resp: 16   Temp: 98.4 F (36.9 C)   SpO2: 92%    Vitals:   07/03/19 2107 07/03/19 2214 07/04/19 0510 07/04/19 0932  BP: 110/69 104/73 108/75 118/65  Pulse: 73 69 99 (!) 105  Resp: 18 18 16    Temp: 98.6 F (37 C) 99.5 F (37.5 C) 98.4 F (36.9 C)   TempSrc: Oral Oral Oral   SpO2: 93% 94% 92%   Weight:      Height:        General: Pt is alert, awake, not in acute distress Cardiovascular: RRR, S1/S2 +, no rubs, no gallops Respiratory: CTA bilaterally, no wheezing, no rhonchi Abdominal: Soft, NT, ND,  bowel sounds + Extremities: no edema, no cyanosis    The results of significant diagnostics from this hospitalization (including imaging, microbiology, ancillary and laboratory) are listed below for reference.     Microbiology: Recent Results (from the past 240 hour(s))  Culture, blood (routine x 2)     Status: None (Preliminary result)   Collection Time: 07/02/19  7:22 PM   Specimen: BLOOD  Result Value Ref Range Status   Specimen Description   Final    BLOOD BLOOD RIGHT HAND Performed at [facility], [address] [place], [place]    Special Requests   Final    BOTTLES DRAWN AEROBIC AND ANAEROBIC Blood Culture adequate volume Performed at [facility], [address] [place], [place]    Culture   Final    NO GROWTH < 12 HOURS Performed at [facility], [address], [place], [place]    Report Status PENDING  Incomplete  Respiratory Panel by RT PCR (Flu A&B, Covid) - Nasopharyngeal Swab     Status: None   Collection Time: 07/02/19  7:26 PM   Specimen: Nasopharyngeal Swab  Result Value Ref Range Status   SARS Coronavirus 2 by RT PCR NEGATIVE NEGATIVE Final    Comment: (NOTE) SARS-CoV-2 target nucleic acids are NOT DETECTED. The SARS-CoV-2 RNA is generally detectable in upper respiratoy specimens during the acute phase of infection. The lowest concentration of SARS-CoV-2 viral copies this assay can detect is 131 copies/mL. A negative result does not preclude SARS-Cov-2 infection and should not be used as the sole basis for treatment or other patient management decisions. A negative result may occur with  improper specimen collection/handling, submission of specimen other than nasopharyngeal swab, presence of viral mutation(s) within the areas targeted by this assay, and inadequate number of viral copies (<131 copies/mL). A negative result must be combined with clinical observations, patient history, and  epidemiological information. The expected result is Negative. Fact Sheet for Patients:  09/01/19 Fact Sheet for Healthcare Providers:  https://www.moore.com/ This test is not yet ap proved or cleared by the https://www.young.biz/ FDA and  has been authorized for detection and/or diagnosis of SARS-CoV-2 by FDA under an Emergency Use Authorization (EUA). This EUA will remain  in effect (meaning this test can be used) for the duration of the COVID-19 declaration under Section 564(b)(1) of the Act, 21 U.S.C. section 360bbb-3(b)(1), unless the authorization is terminated or revoked sooner.    Influenza A by PCR NEGATIVE NEGATIVE Final   Influenza B by PCR NEGATIVE NEGATIVE Final    Comment: (NOTE) The Xpert Xpress SARS-CoV-2/FLU/RSV assay is intended as an aid in  the diagnosis of influenza from Nasopharyngeal swab  specimens and  should not be used as a sole basis for treatment. Nasal washings and  aspirates are unacceptable for Xpert Xpress SARS-CoV-2/FLU/RSV  testing. Fact Sheet for Patients: https://www.moore.com/https://www.fda.gov/media/142436/download Fact Sheet for Healthcare Providers: https://www.young.biz/https://www.fda.gov/media/142435/download This test is not yet approved or cleared by the [place] FDA and  has been authorized for detection and/or diagnosis of SARS-CoV-2 by  FDA under an Emergency Use Authorization (EUA). This EUA will remain  in effect (meaning this test can be used) for the duration of the  Covid-19 declaration under Section 564(b)(1) of the Act, 21  U.S.C. section 360bbb-3(b)(1), unless the authorization is  terminated or revoked. Performed at [facility] [place], [address] [place], [place] [address]   Culture, blood (routine x 2)     Status: None (Preliminary result)   Collection Time: 07/02/19  7:43 PM   Specimen: BLOOD  Result Value Ref Range Status   Specimen Description   Final    BLOOD LEFT ANTECUBITAL Performed at [facility]  [place], [address] [place], [place] [address]    Special Requests   Final    BOTTLES DRAWN AEROBIC AND ANAEROBIC Blood Culture adequate volume Performed at [facility] [place], [address] [place], [place] [address]    Culture   Final    NO GROWTH < 12 HOURS Performed at [facility], [address] [place], [place] [address]    Report Status PENDING  Incomplete  MRSA PCR Screening     Status: None   Collection Time: 07/03/19  1:24 AM   Specimen: Nasal Mucosa; Nasopharyngeal  Result Value Ref Range Status   MRSA by PCR NEGATIVE NEGATIVE Final    Comment:        The GeneXpert MRSA Assay (FDA approved for NASAL specimens only), is one component of a comprehensive MRSA colonization surveillance program. It is not intended to diagnose MRSA infection nor to guide or monitor treatment for MRSA infections. Performed at [facility], [address] [place], [place] [address]      Labs: BNP (last 3 results) No results for input(s): BNP in the last 8760 hours. Basic Metabolic Panel: Recent Labs  Lab 07/02/19 1921 07/03/19 0538 07/04/19 0524  NA 136 137 137  K 3.8 3.5 3.8  CL 103 107 107  CO2 24 23 23   GLUCOSE 129* 126* 118*  BUN 18 13 13   CREATININE 1.02 1.02 0.98  CALCIUM 8.6* 8.3* 8.2*  MG  --   --  1.8  PHOS  --   --  2.5   Liver Function Tests: Recent Labs  Lab 07/02/19 1921 07/03/19 0538 07/04/19 0524  AST 22 42*  --   ALT 18 36  --   ALKPHOS 63 71  --   BILITOT 1.1 1.3*  --   PROT 6.6 6.2*  --   ALBUMIN 3.7 3.3* 3.2*   Recent Labs  Lab 07/02/19 1921  LIPASE 122*   No results for input(s): AMMONIA in the last 168 hours. CBC: Recent Labs  Lab 07/02/19 1921 07/03/19 0538 07/04/19 0524  WBC 5.6 6.9 7.9  NEUTROABS 4.4  --  5.8  HGB 13.3 12.7* 12.5*  HCT 40.2 39.9 39.4  MCV 89.5 92.8 91.4  PLT 157 129* 141*   Cardiac Enzymes: No results for input(s): CKTOTAL, CKMB, CKMBINDEX, TROPONINI in the last  168 hours. BNP: Invalid input(s): POCBNP CBG: No results for input(s): GLUCAP in the last 168 hours. D-Dimer  No results for input(s): DDIMER in the last 72 hours. Hgb A1c No results for input(s): HGBA1C in the last 72 hours. Lipid Profile No results for input(s): CHOL, HDL, LDLCALC, TRIG, CHOLHDL, LDLDIRECT in the last 72 hours. Thyroid function studies No results for input(s): TSH, T4TOTAL, T3FREE, THYROIDAB in the last 72 hours.  Invalid input(s): FREET3 Anemia work up No results for input(s): VITAMINB12, FOLATE, FERRITIN, TIBC, IRON, RETICCTPCT in the last 72 hours. Urinalysis    Component Value Date/Time   COLORURINE YELLOW 07/02/2019 2135   APPEARANCEUR CLEAR 07/02/2019 2135   LABSPEC 1.015 07/02/2019 2135   PHURINE 5.5 07/02/2019 2135   GLUCOSEU NEGATIVE 07/02/2019 2135   HGBUR NEGATIVE 07/02/2019 2135   BILIRUBINUR NEGATIVE 07/02/2019 2135   KETONESUR 15 (A) 07/02/2019 2135   PROTEINUR 30 (A) 07/02/2019 2135   NITRITE NEGATIVE 07/02/2019 2135   LEUKOCYTESUR NEGATIVE 07/02/2019 2135   Sepsis Labs Invalid input(s): PROCALCITONIN,  WBC,  LACTICIDVEN Microbiology Recent Results (from the past 240 hour(s))  Culture, blood (routine x 2)     Status: None (Preliminary result)   Collection Time: 07/02/19  7:22 PM   Specimen: BLOOD  Result Value Ref Range Status   Specimen Description   Final    BLOOD BLOOD RIGHT HAND Performed at [facility], [address] [place], [place] [address]    Special Requests   Final    BOTTLES DRAWN AEROBIC AND ANAEROBIC Blood Culture adequate volume Performed at [facility], [address] [place], [place] [address]    Culture   Final    NO GROWTH < 12 HOURS Performed at [facility], [place] [address] [place], [place] [address]    Report Status PENDING  Incomplete  Respiratory Panel by RT PCR (Flu A&B, Covid) - Nasopharyngeal Swab     Status: None   Collection Time: 07/02/19  7:26 PM   Specimen:  Nasopharyngeal Swab  Result Value Ref Range Status   SARS Coronavirus 2 by RT PCR NEGATIVE NEGATIVE Final    Comment: (NOTE) SARS-CoV-2 target nucleic acids are NOT DETECTED. The SARS-CoV-2 RNA is generally detectable in upper respiratoy specimens during the acute phase of infection. The lowest concentration of SARS-CoV-2 viral copies this assay can detect is 131 copies/mL. A negative result does not preclude SARS-Cov-2 infection and should not be used as the sole basis for treatment or other patient management decisions. A negative result may occur with  improper specimen collection/handling, submission of specimen other than nasopharyngeal swab, presence of viral mutation(s) within the areas targeted by this assay, and inadequate number of viral copies (<131 copies/mL). A negative result must be combined with clinical observations, patient history, and epidemiological information. The expected result is Negative. Fact Sheet for Patients:  PinkCheek.be Fact Sheet for Healthcare Providers:  GravelBags.it This test is not yet ap proved or cleared by the [place] FDA and  has been authorized for detection and/or diagnosis of SARS-CoV-2 by FDA under an Emergency Use Authorization (EUA). This EUA will remain  in effect (meaning this test can be used) for the duration of the COVID-19 declaration under Section 564(b)(1) of the Act, 21 U.S.C. section 360bbb-3(b)(1), unless the authorization is terminated or revoked sooner.    Influenza A by PCR NEGATIVE NEGATIVE Final   Influenza B by PCR NEGATIVE NEGATIVE Final    Comment: (NOTE) The Xpert Xpress SARS-CoV-2/FLU/RSV assay is intended as an aid in  the diagnosis of influenza from Nasopharyngeal swab specimens and  should not be used as a sole basis for treatment. Nasal washings and  aspirates are unacceptable for Xpert Xpress SARS-CoV-2/FLU/RSV  testing. Fact Sheet for  Patients: https://www.moore.com/ Fact Sheet for Healthcare Providers: https://www.young.biz/ This test is not yet approved or cleared by the [place] FDA and  has been authorized for detection and/or diagnosis of SARS-CoV-2 by  FDA under an Emergency Use Authorization (EUA). This EUA will remain  in effect (meaning this test can be used) for the duration of the  Covid-19 declaration under Section 564(b)(1) of the Act, 21  U.S.C. section 360bbb-3(b)(1), unless the authorization is  terminated or revoked. Performed at [facility], [address] [place], [place] [address]   Culture, blood (routine x 2)     Status: None (Preliminary result)   Collection Time: 07/02/19  7:43 PM   Specimen: BLOOD  Result Value Ref Range Status   Specimen Description   Final    BLOOD LEFT ANTECUBITAL Performed at [facility], [address] [place], [place] [address]    Special Requests   Final    BOTTLES DRAWN AEROBIC AND ANAEROBIC Blood Culture adequate volume Performed at [facility], [address] [place], [place] [address]    Culture   Final    NO GROWTH < 12 HOURS Performed at [facility], [address], [place], [place] [address]    Report Status PENDING  Incomplete  MRSA PCR Screening     Status: None   Collection Time: 07/03/19  1:24 AM   Specimen: Nasal Mucosa; Nasopharyngeal  Result Value Ref Range Status   MRSA by PCR NEGATIVE NEGATIVE Final    Comment:        The GeneXpert MRSA Assay (FDA approved for NASAL specimens only), is one component of a comprehensive MRSA colonization surveillance program. It is not intended to diagnose MRSA infection nor to guide or monitor treatment for MRSA infections. Performed at [facility], [address] [place], [place] [address]     Please note: You were cared for by a hospitalist during your hospital stay. Once you are  discharged, your primary care physician will handle any further medical issues. Please note that NO REFILLS for any discharge medications will be authorized once you are discharged, as it is imperative that you return to your primary care physician (or establish a relationship with a primary care physician if you do not have one) for your post hospital discharge needs so that they can reassess your need for medications and monitor your lab values.    Time coordinating discharge: 40 minutes  SIGNED:   Burnadette Pop, MD  [facility] 07/04/2019, 10:43 AM Pager [phone]  If 7PM-7AM, please contact night-coverage [email or web address] Password [email or web address]

## 2019-07-04 NOTE — Progress Notes (Signed)
Patient discharge in a wheelchair. Discharge documentation reviewed with patient.

## 2019-07-06 MED ORDER — SODIUM CHLORIDE FLUSH 0.9 % IV SOLN
5.00 | INTRAVENOUS | Status: DC
Start: ? — End: 2019-07-06

## 2019-07-06 MED ORDER — QUINTABS PO TABS
1.00 | ORAL_TABLET | ORAL | Status: DC
Start: 2019-07-10 — End: 2019-07-06

## 2019-07-06 MED ORDER — ACETAMINOPHEN 325 MG PO TABS
650.00 | ORAL_TABLET | ORAL | Status: DC
Start: ? — End: 2019-07-06

## 2019-07-06 MED ORDER — PANTOPRAZOLE SODIUM 40 MG PO TBEC
40.00 | DELAYED_RELEASE_TABLET | ORAL | Status: DC
Start: 2019-07-10 — End: 2019-07-06

## 2019-07-06 MED ORDER — MAGNESIUM OXIDE 400 MG PO TABS
400.00 | ORAL_TABLET | ORAL | Status: DC
Start: 2019-07-06 — End: 2019-07-06

## 2019-07-06 MED ORDER — APIXABAN 5 MG PO TABS
5.00 | ORAL_TABLET | ORAL | Status: DC
Start: 2019-07-09 — End: 2019-07-06

## 2019-07-06 MED ORDER — GENERIC EXTERNAL MEDICATION
3.38 | Status: DC
Start: 2019-07-06 — End: 2019-07-06

## 2019-07-06 MED ORDER — DILTIAZEM HCL ER BEADS 120 MG PO CP24
240.00 | ORAL_CAPSULE | ORAL | Status: DC
Start: 2019-07-10 — End: 2019-07-06

## 2019-07-06 MED ORDER — ONDANSETRON HCL 4 MG/2ML IJ SOLN
4.00 | INTRAMUSCULAR | Status: DC
Start: ? — End: 2019-07-06

## 2019-07-06 MED ORDER — PROPAFENONE HCL 150 MG PO TABS
150.00 | ORAL_TABLET | ORAL | Status: DC
Start: 2019-07-09 — End: 2019-07-06

## 2019-07-06 MED ORDER — SODIUM CHLORIDE FLUSH 0.9 % IV SOLN
5.00 | INTRAVENOUS | Status: DC
Start: 2019-07-09 — End: 2019-07-06

## 2019-07-07 LAB — CULTURE, BLOOD (ROUTINE X 2)
Culture: NO GROWTH
Culture: NO GROWTH
Special Requests: ADEQUATE
Special Requests: ADEQUATE

## 2019-07-08 MED ORDER — GENERIC EXTERNAL MEDICATION
5.00 | Status: DC
Start: ? — End: 2019-07-08

## 2019-08-30 DEATH — deceased

## 2020-08-11 IMAGING — CT CT ABD-PELV W/ CM
2 of 5 series · 16 of 46 positions shown, 18 images · IV contrast (Omnipaque)
Comparison: Plain film from the previous day, CT from 06/09/2012

CLINICAL DATA: Abdominal pain for several days

EXAM:
CT ABDOMEN AND PELVIS WITH CONTRAST
TECHNIQUE: Multidetector CT imaging of the abdomen and pelvis was performed
using the standard protocol following bolus administration of
intravenous contrast.
CONTRAST:  100mL OMNIPAQUE IOHEXOL 300 MG/ML  SOLN

[Series 2: axial st · axial · 0.79mm/px · z∈[+282,+717]mm · 13 of 97 slices shown, 15 images]
[im 5/97  soft-tissue]
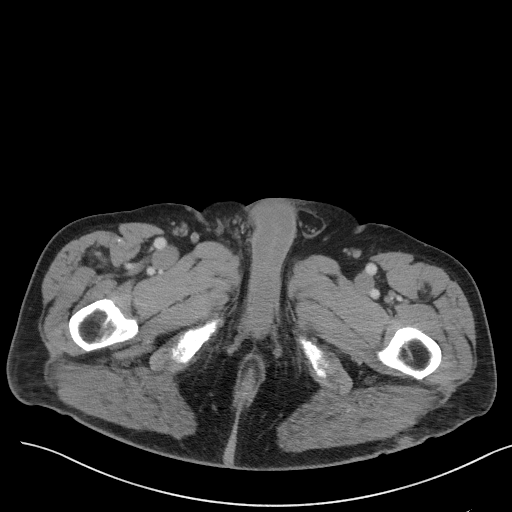
[im 5/97  bone]
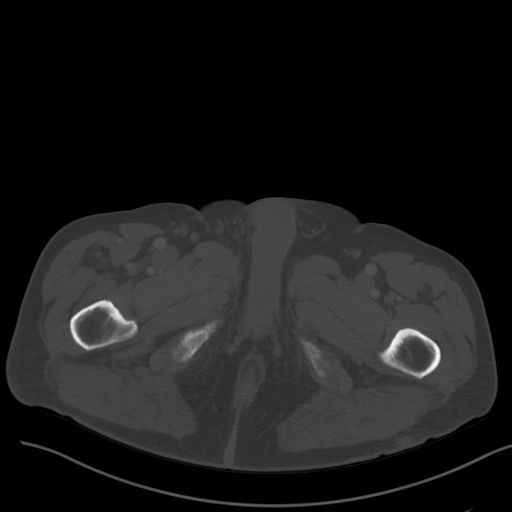
[im 15/97  soft-tissue]
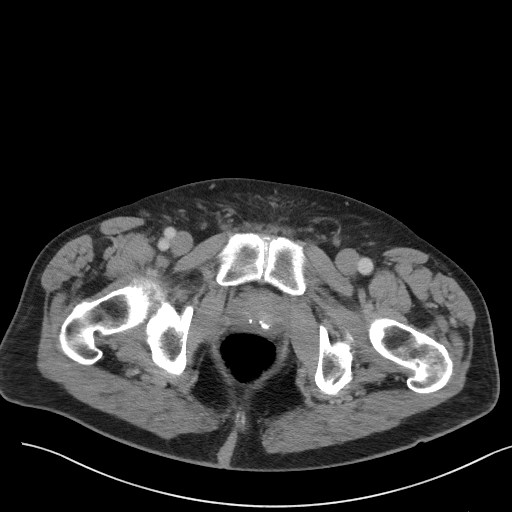
[im 20/97  soft-tissue]
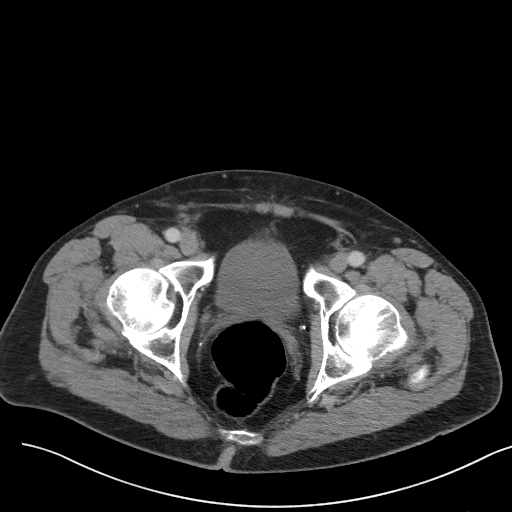
[im 29/97  soft-tissue]
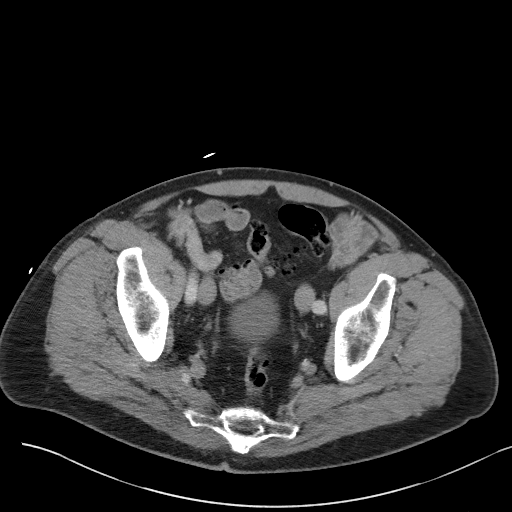
[im 34/97  soft-tissue]
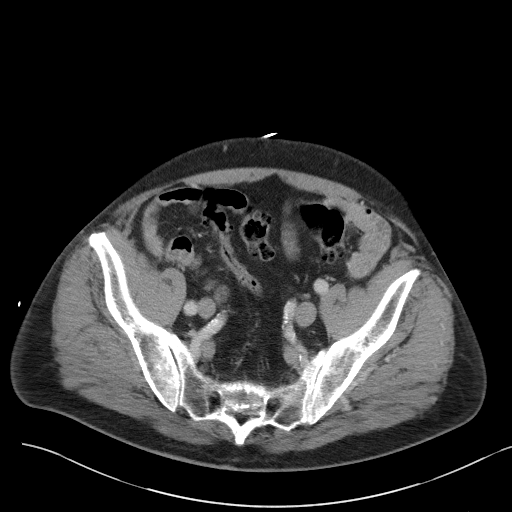
[im 44/97  soft-tissue]
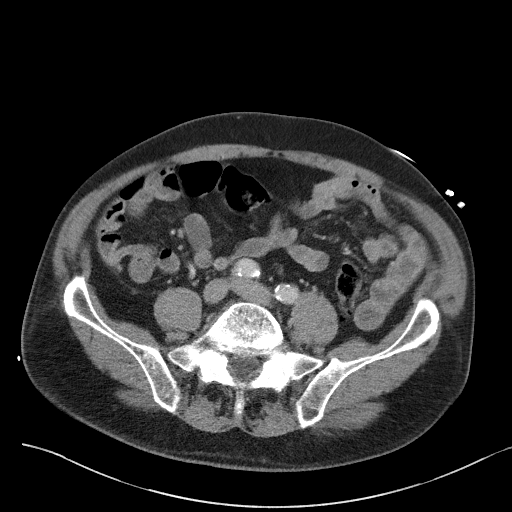
[im 49/97  soft-tissue]
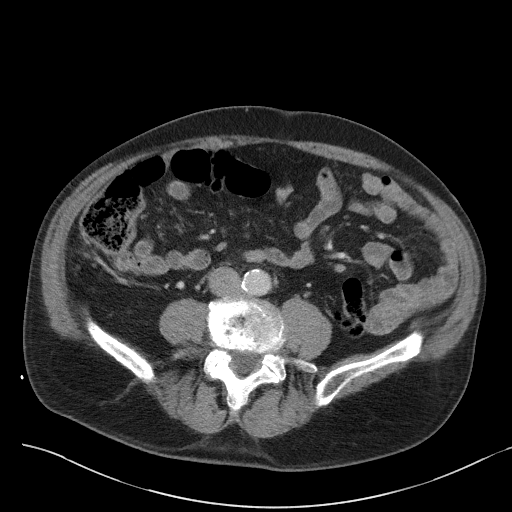
[im 53/97  soft-tissue]
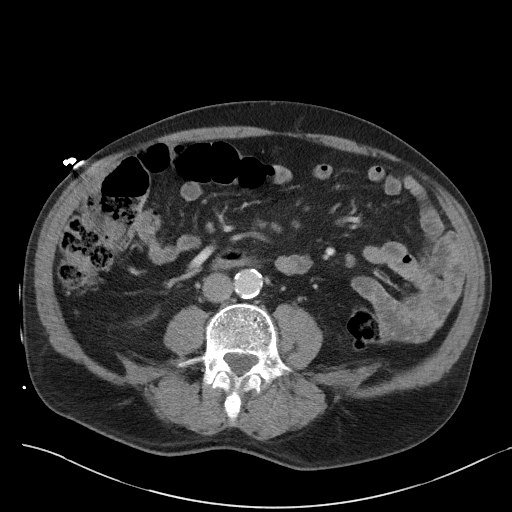
[im 63/97  soft-tissue]
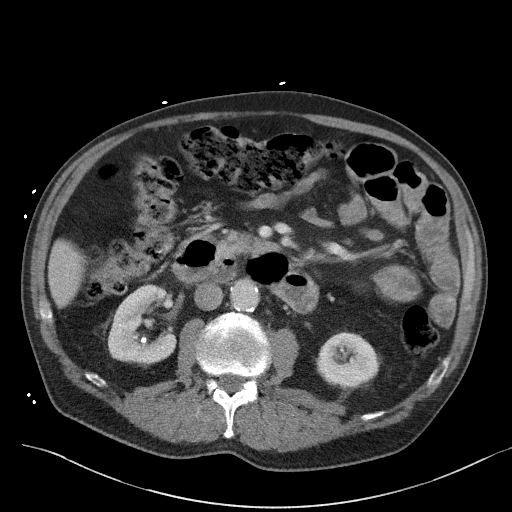
[im 63/97  bone]
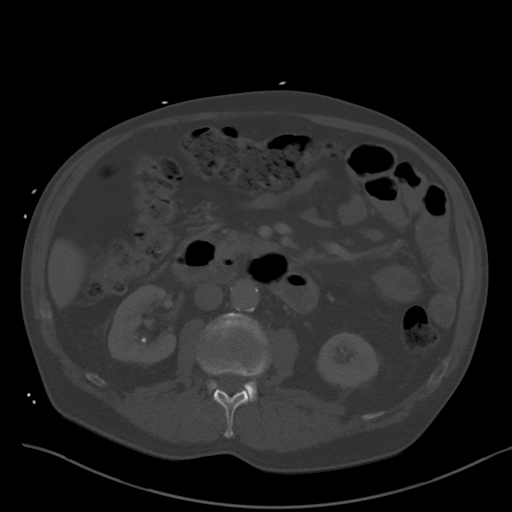
[im 68/97  soft-tissue]
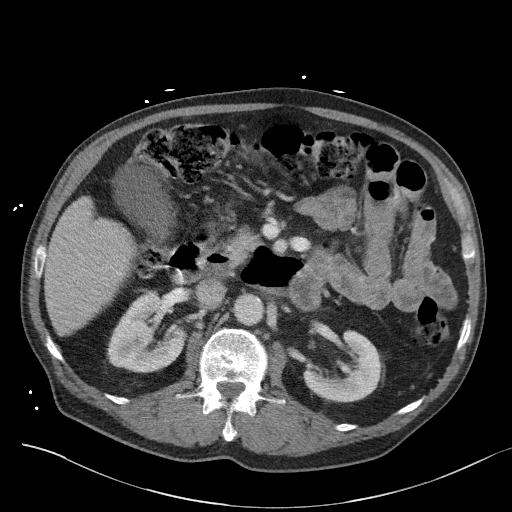
[im 77/97  soft-tissue]
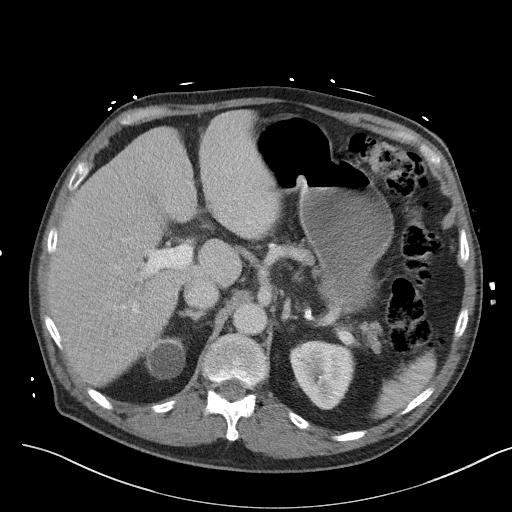
[im 82/97  soft-tissue]
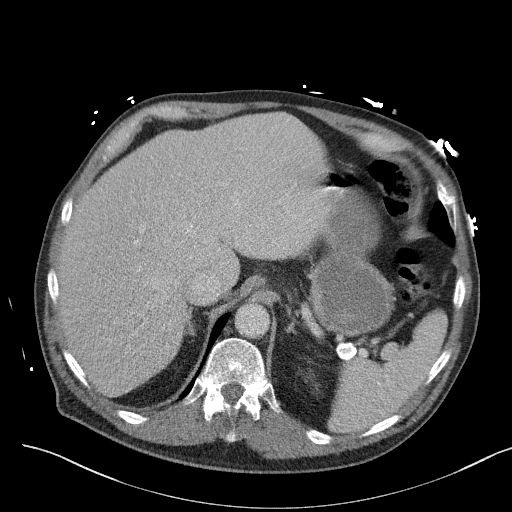
[im 92/97  soft-tissue]
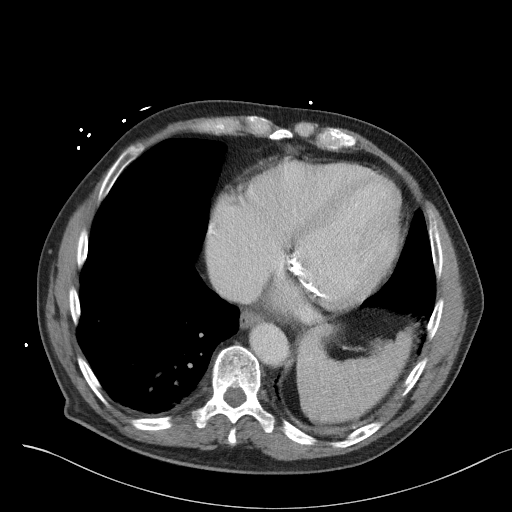

[Series 5: coronal st · coronal · 0.81mm/px · 3 of 98 slices shown]
[im 33/98  soft-tissue]
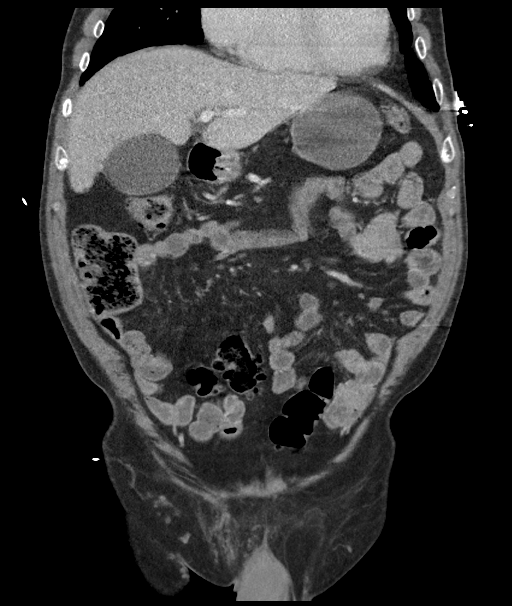
[im 44/98  soft-tissue]
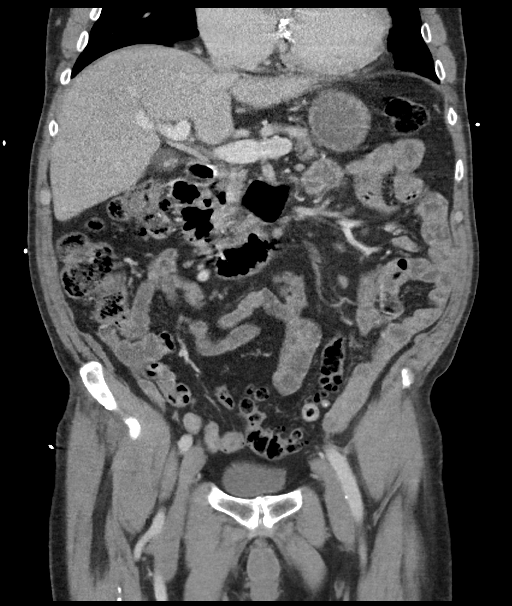
[im 54/98  soft-tissue]
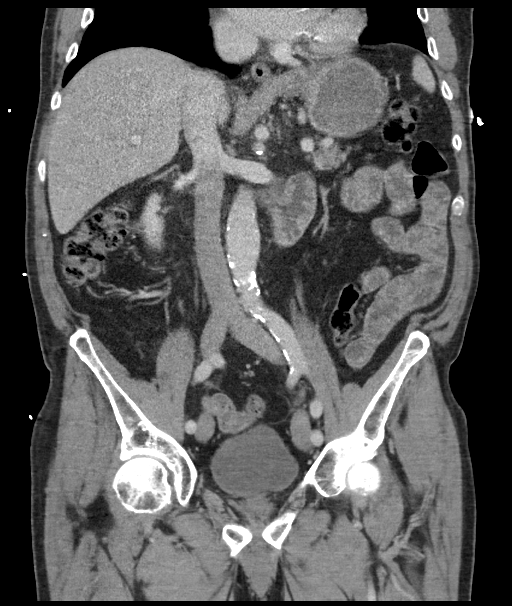

[16 of 46 positions shown; findings below may reference images not displayed]

FINDINGS: Lower chest: Mild bibasilar atelectatic changes are noted.

Hepatobiliary: Liver shows mild decreased attenuation consistent
with fatty infiltration. Gallbladder is well distended with
dependent gallstones. No wall thickening or pericholecystic fluid is
noted.

Pancreas: Unremarkable. No pancreatic ductal dilatation or
surrounding inflammatory changes.

Spleen: Normal in size without focal abnormality.

Adrenals/Urinary Tract: Adrenal glands are within normal limits.
Bilateral cystic changes are seen. Nonobstructing right renal
calculi are noted. The largest of these is noted in the upper pole
of the right kidney measuring 7 mm. No left-sided calculi seen. The
ureters appear within normal limits. Bladder is decompressed.

Stomach/Bowel: Scattered diverticular change of the colon is noted.
No findings to suggest diverticulitis are seen. The appendix is
within normal limits. Duodenal diverticulum is noted adjacent to the
head of the pancreas. No obstructive changes are seen in the small
bowel. Stomach is within normal limits.

Vascular/Lymphatic: There is a 15 mm partially calcified splenic
artery aneurysm identified stable from the prior exam. Aortic
calcifications are noted without aneurysmal dilatation. No
lymphadenopathy is identified.

Reproductive: Prostate is unremarkable.

Other: No abdominal wall hernia or abnormality. No abdominopelvic
ascites.

Musculoskeletal: Mild degenerative changes of lumbar spine are
noted. Chronic appearing compression deformities of L2 and L3 are
noted.
IMPRESSION: Nonobstructing right renal calculi as described.

Diverticulosis without diverticulitis.

Stable chronic changes as described above.
